# Patient Record
Sex: Female | Born: 1965 | Race: White | Hispanic: No | Marital: Married | Smoking: Former smoker
Health system: Southern US, Community
[De-identification: ages and names within clinical notes are randomized; demographics above are authoritative.]

## PROBLEM LIST (undated history)

## (undated) DIAGNOSIS — T7840XA Allergy, unspecified, initial encounter: Secondary | ICD-10-CM

## (undated) DIAGNOSIS — I10 Essential (primary) hypertension: Secondary | ICD-10-CM

## (undated) DIAGNOSIS — M1990 Unspecified osteoarthritis, unspecified site: Secondary | ICD-10-CM

## (undated) HISTORY — DX: Unspecified osteoarthritis, unspecified site: M19.90

## (undated) HISTORY — PX: OTHER SURGICAL HISTORY: SHX169

## (undated) HISTORY — PX: COLONOSCOPY: SHX174

## (undated) HISTORY — PX: BREAST SURGERY: SHX581

## (undated) HISTORY — PX: TUBAL LIGATION: SHX77

## (undated) HISTORY — DX: Allergy, unspecified, initial encounter: T78.40XA

---

## 2000-12-16 ENCOUNTER — Other Ambulatory Visit: Admission: RE | Admit: 2000-12-16 | Discharge: 2000-12-16 | Payer: Self-pay | Admitting: Obstetrics and Gynecology

## 2001-09-22 ENCOUNTER — Encounter: Payer: Self-pay | Admitting: Family Medicine

## 2001-09-22 ENCOUNTER — Ambulatory Visit (HOSPITAL_COMMUNITY): Admission: RE | Admit: 2001-09-22 | Discharge: 2001-09-22 | Payer: Self-pay | Admitting: Family Medicine

## 2002-12-19 ENCOUNTER — Other Ambulatory Visit: Admission: RE | Admit: 2002-12-19 | Discharge: 2002-12-19 | Payer: Self-pay | Admitting: Obstetrics and Gynecology

## 2003-01-12 ENCOUNTER — Encounter: Payer: Self-pay | Admitting: Obstetrics and Gynecology

## 2003-01-12 ENCOUNTER — Ambulatory Visit (HOSPITAL_COMMUNITY): Admission: RE | Admit: 2003-01-12 | Discharge: 2003-01-12 | Payer: Self-pay | Admitting: Obstetrics and Gynecology

## 2004-01-25 ENCOUNTER — Inpatient Hospital Stay (HOSPITAL_COMMUNITY): Admission: AD | Admit: 2004-01-25 | Discharge: 2004-01-25 | Payer: Self-pay | Admitting: Obstetrics and Gynecology

## 2004-02-04 ENCOUNTER — Inpatient Hospital Stay (HOSPITAL_COMMUNITY): Admission: AD | Admit: 2004-02-04 | Discharge: 2004-02-09 | Payer: Self-pay | Admitting: Obstetrics and Gynecology

## 2004-02-05 IMAGING — US US OB COMP +14 WK
1 series · 13 of 25 positions shown · non-contrast
Comparison: none

CLINICAL DATA: 36 weeks gestation with elevated blood pressure.  Assess fetal weight.

[Series 1: us ob comp +14 wk · 0.37mm/px · 13 of 25 slices shown]
[im 1/25]
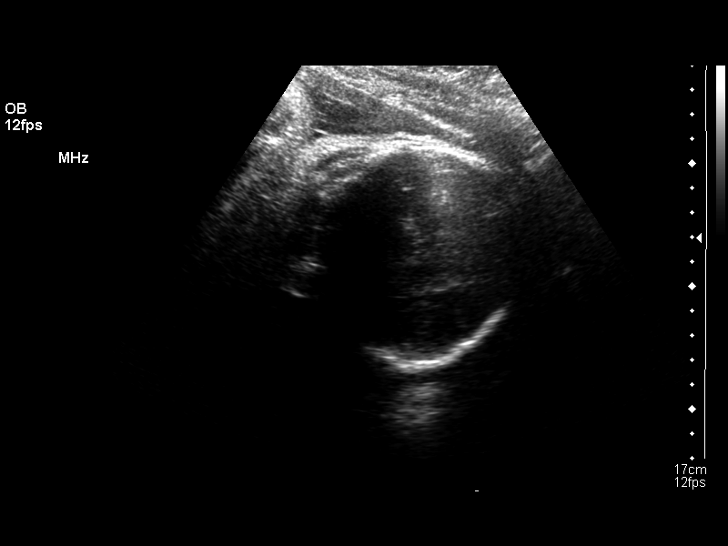
[im 3/25]
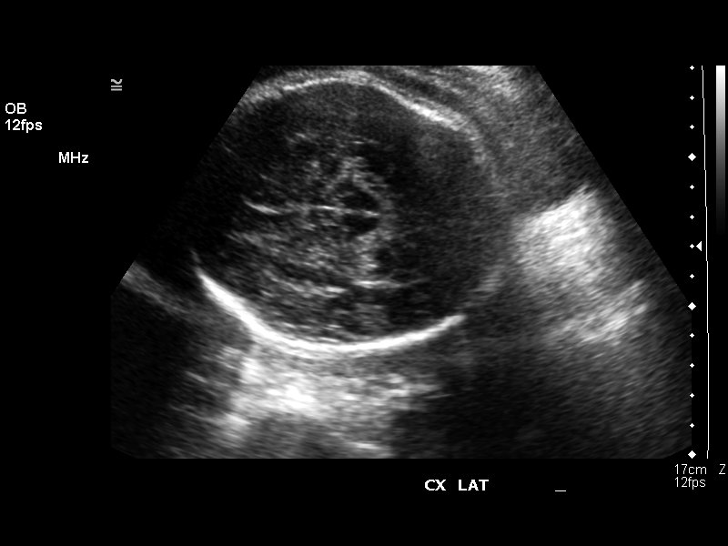
[im 5/25]
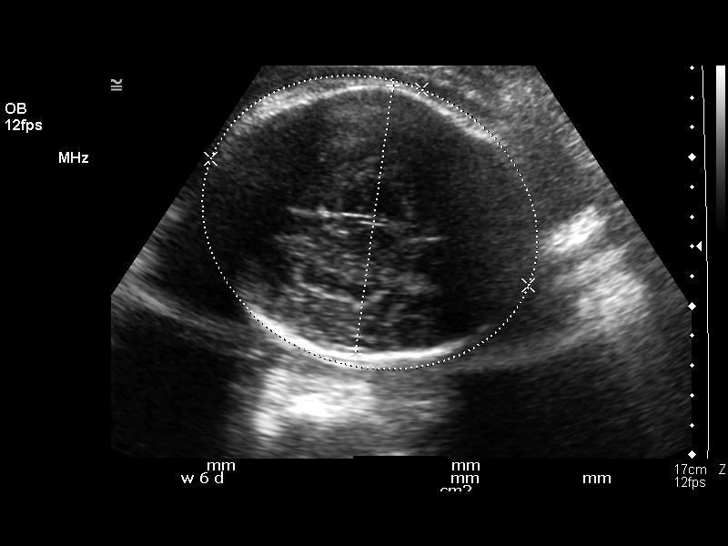
[im 7/25]
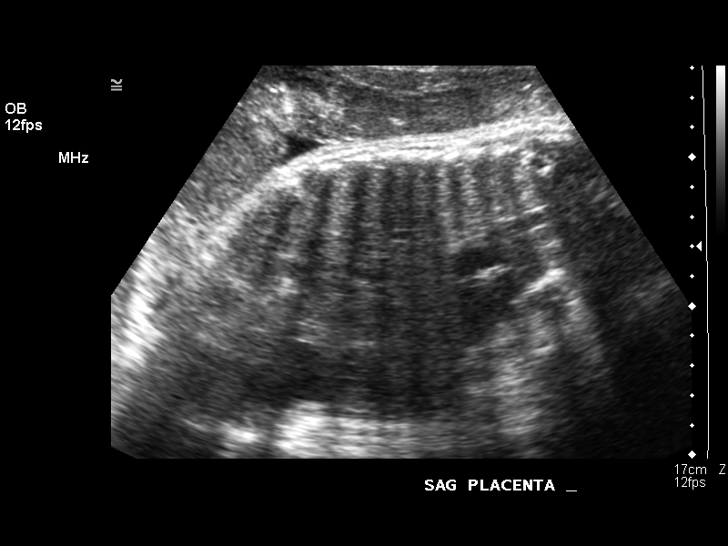
[im 9/25]
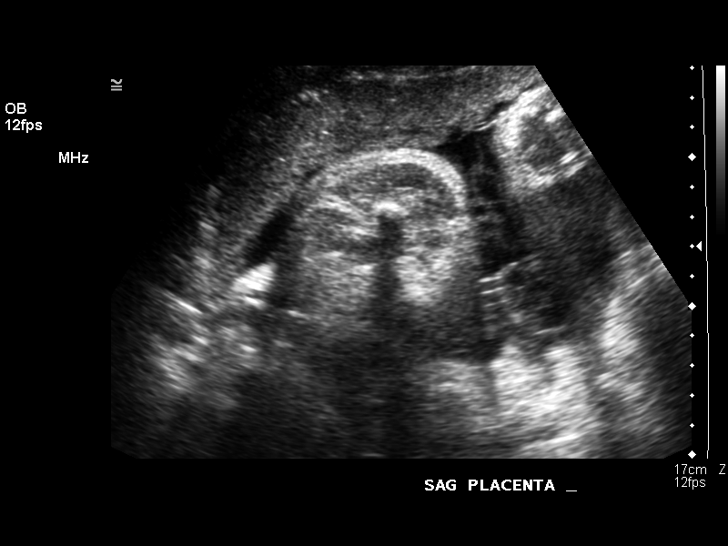
[im 11/25]
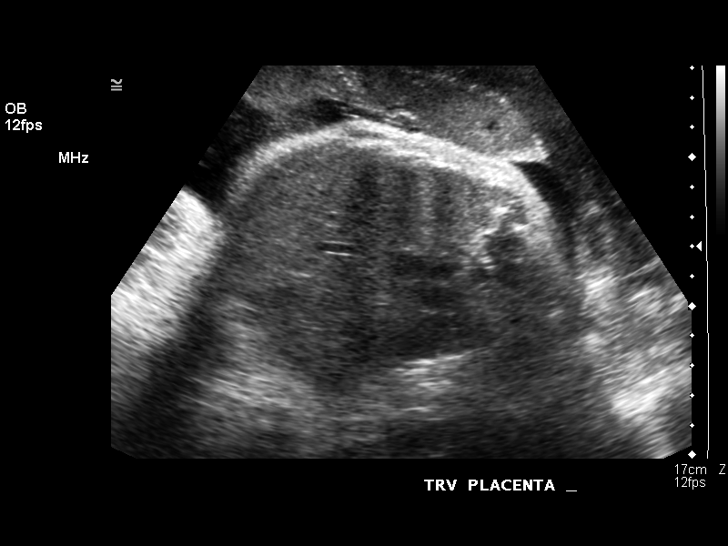
[im 13/25]
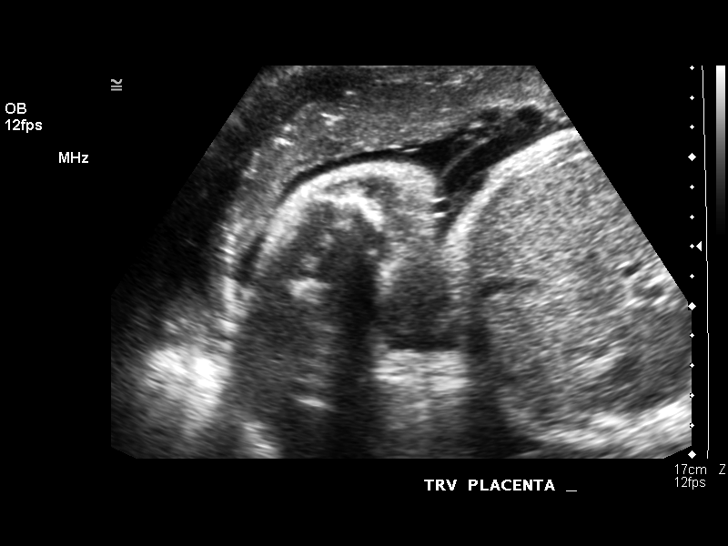
[im 15/25]
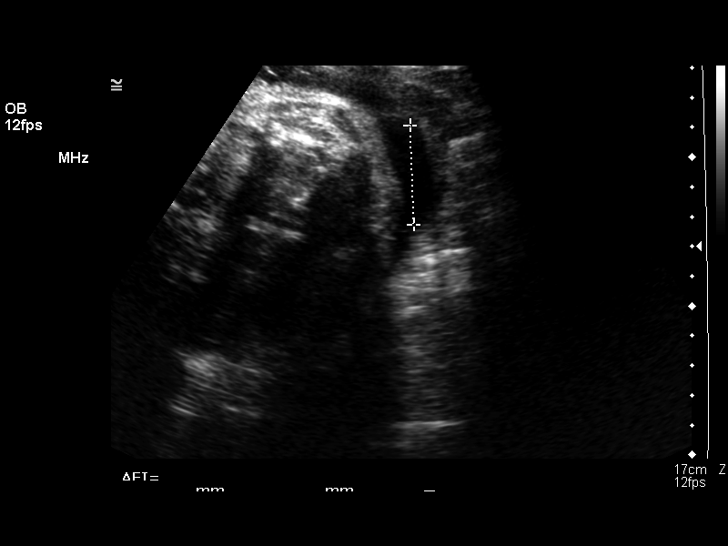
[im 17/25]
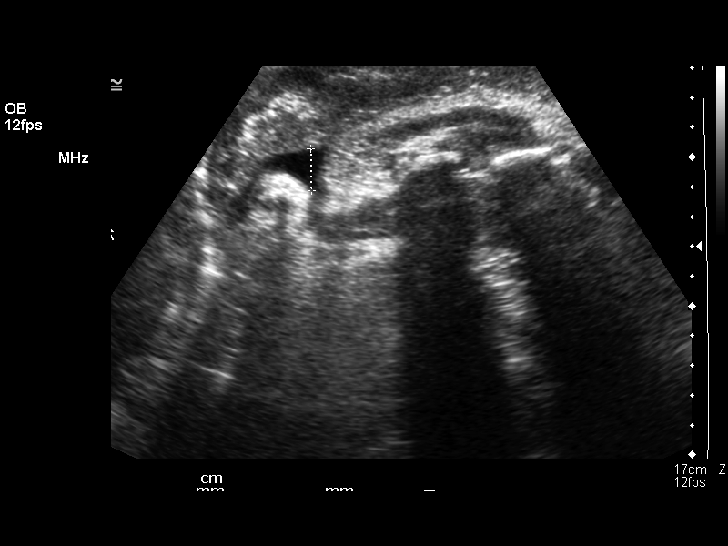
[im 19/25]
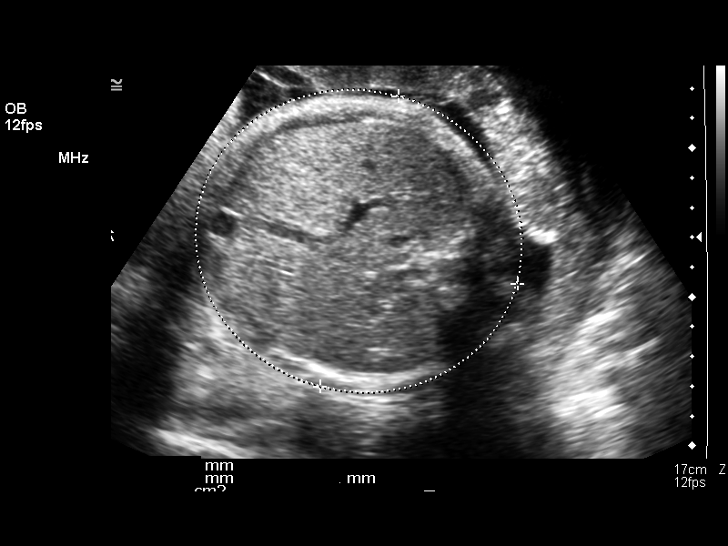
[im 21/25]
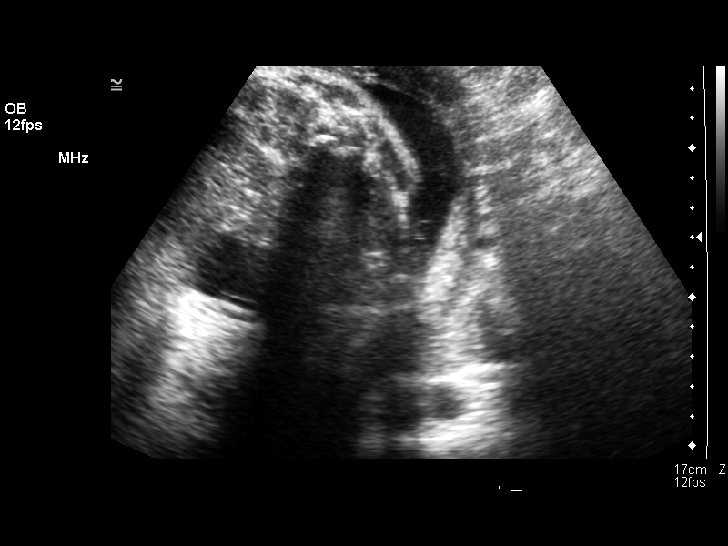
[im 23/25]
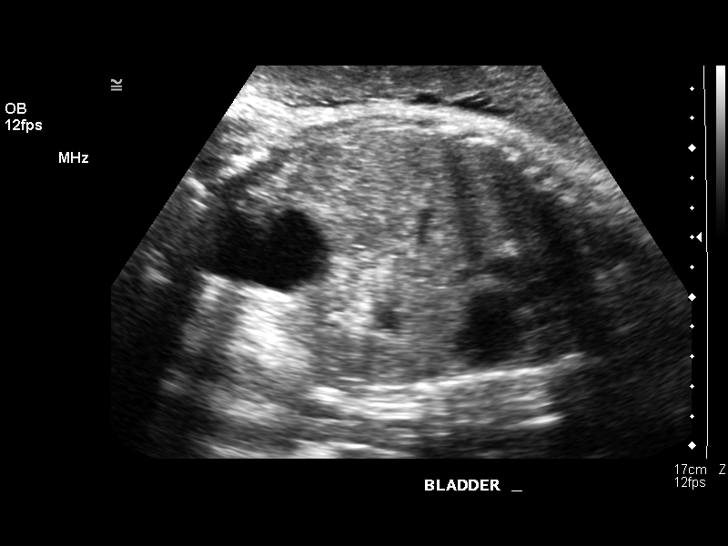
[im 25/25]
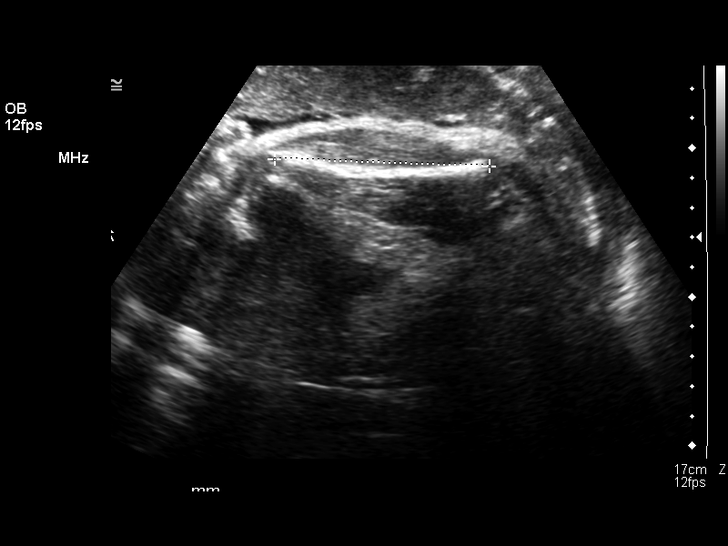

[13 of 25 positions shown; findings below may reference images not displayed]

OBSTETRICAL ULTRASOUND
 Number of Fetuses:  1
 Heart Rate:  131
 Movement:  Yes
 Breathing:  No  
 Presentation:  Cephalic
 Placental Location:  Anterior
 Grade:  II
 Previa:  No
 Amniotic Fluid (Subjective):  Normal
 Amniotic Fluid (Objective):   11.5 cm AFI (5th -95th%ile = 7.7 ? 24.9 cm for 36 wks)

 FETAL BIOMETRY
 BPD:   9.1 cm   36 w 6 d
 HC:   32.8 cm  37 w 1 d
 AC:   33.2 cm   37 w 1 d
 FL:    7.2 cm   37 w 0 d

 MEAN GA:  37 w 0 d

 EFW:  [PU] + / - 464 g (H) 75th ? 90th%ile ([PU] ? [PU] g) for 36 wks

 FETAL ANATOMY  
 Lateral Ventricles:      Visualized     
 Thalami/CSP:      Visualized 
 Posterior Fossa:  Not visualized 
 Nuchal Region:    N/A
 Spine:      Not visualized 
 4 Chamber Heart on Left:      Not visualized 
 Stomach on Left:      Visualized 
 3 Vessel Cord:    Not visualized 
 Cord Insertion site:    Not visualized 
 Kidneys:  Visualized 
 Bladder:  Visualized 
 Extremities:      Not visualized 

 Evaluation limited by:  Advanced gestational age 

 MATERNAL FINDINGS  
 Cervix:   3.2 cm Transabdominally
IMPRESSION: Single living intrauterine fetus in cephalic presentation with subjectively and quantitatively normal amniotic fluid volume.    Best estimated fetal weight is [PU] + / - 464 grams.  
 Assessment of fetal anatomy is limited by the late gestational age.  

 </u12:p>

## 2004-02-07 ENCOUNTER — Encounter (INDEPENDENT_AMBULATORY_CARE_PROVIDER_SITE_OTHER): Payer: Self-pay | Admitting: *Deleted

## 2004-03-20 ENCOUNTER — Other Ambulatory Visit: Admission: RE | Admit: 2004-03-20 | Discharge: 2004-03-20 | Payer: Self-pay | Admitting: Obstetrics and Gynecology

## 2004-11-27 ENCOUNTER — Ambulatory Visit (HOSPITAL_COMMUNITY): Admission: RE | Admit: 2004-11-27 | Discharge: 2004-11-27 | Payer: Self-pay | Admitting: Obstetrics and Gynecology

## 2006-01-14 ENCOUNTER — Encounter: Admission: RE | Admit: 2006-01-14 | Discharge: 2006-01-14 | Payer: Self-pay | Admitting: Obstetrics and Gynecology

## 2009-01-17 ENCOUNTER — Encounter: Admission: RE | Admit: 2009-01-17 | Discharge: 2009-01-17 | Payer: Self-pay | Admitting: Obstetrics and Gynecology
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
## 2010-08-29 NOTE — Op Note (Signed)
NAME:  Nicole [name], ARSENEAU                  ACCOUNT NO.:  [id number]   MEDICAL RECORD NO.:  [id number]          PATIENT TYPE:  AMB   LOCATION:  SDC                           FACILITY:  [facility]   PHYSICIAN:  Zenaida Niece, M.D.DATE OF BIRTH:  1965-09-08   DATE OF PROCEDURE:  11/27/2004  DATE OF DISCHARGE:                                 OPERATIVE REPORT   PREOPERATIVE DIAGNOSIS:  Desires surgical sterility.   POSTOPERATIVE DIAGNOSIS:  Desires surgical sterility.   PROCEDURE:  Laparoscopy with bilateral tubal fulguration.   SURGEON:  Zenaida Niece, M.D.   ANESTHESIA:  General endotracheal tube.   SPECIMENS:  None.   ESTIMATED BLOOD LOSS:  Minimal.   COMPLICATIONS:  None.   FINDINGS:  Normal pelvic and abdominal anatomy.   PROCEDURE IN DETAIL:  The patient was taken to the operating room and placed  in the dorsal supine position.  General anesthesia was induced and she was  placed in mobile stirrups.  The abdomen was prepped and draped in the usual  sterile fashion, bladder drained with a red rubber catheter, Hulka tenaculum  applied to the cervix for uterine manipulation.  Infraumbilical skin was  then infiltrated with 0.25% Marcaine and a 1.5 cm horizontal incision was  made.  The Veress needle was inserted into the peritoneal cavity and  placement confirmed by the water drop test and an opening pressure of 4  mmHg.  CO2 gas was insufflated to a pressure of 40 mmHg and the Veress  needle was removed.  The size 11 disposable trocar was then introduced and  placement confirmed by the laparoscope.  Uterus, tubes and ovaries appeared  normal.  Both fallopian tubes were identified and traced to their fimbriated  ends.  Three segments of the middle of each tube were fulgurated with  bipolar cautery until the amp meter read 0 in all spots.  This achieved good  fulguration and good hemostasis in all spots.  The remainder of her anatomy  appeared normal.  The laparoscope was removed and  all gas allowed to deflate  from the abdomen.  The umbilical trocar was then removed.  The fascia was  reapproximated with a figure-of-eight suture of 0 Vicryl.  Three interrupted  subcuticular  sutures were placed with 4-0 Vicryl Rapide, and the skin was closed with  Dermabond.  The patient tolerated the procedure well, was extubated in the  operating room and taken to the recovery in stable condition.  Counts were  correct.      Zenaida Niece, M.D.  Electronically Signed     TDM/MEDQ  D:  11/27/2004  T:  11/27/2004  Job:  [id number]
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
## 2011-01-20 ENCOUNTER — Other Ambulatory Visit: Payer: Self-pay | Admitting: Obstetrics and Gynecology

## 2011-01-20 DIAGNOSIS — N6321 Unspecified lump in the left breast, upper outer quadrant: Secondary | ICD-10-CM

## 2011-02-02 ENCOUNTER — Ambulatory Visit
Admission: RE | Admit: 2011-02-02 | Discharge: 2011-02-02 | Disposition: A | Discharge status: Home / Self Care | Payer: 59 | Source: Ambulatory Visit | Attending: Obstetrics and Gynecology | Admitting: Obstetrics and Gynecology

## 2011-02-02 ENCOUNTER — Other Ambulatory Visit: Payer: Self-pay | Admitting: Obstetrics and Gynecology

## 2011-02-02 DIAGNOSIS — N6321 Unspecified lump in the left breast, upper outer quadrant: Secondary | ICD-10-CM

## 2011-02-23 ENCOUNTER — Encounter: Payer: Self-pay | Admitting: Internal Medicine

## 2011-07-24 ENCOUNTER — Other Ambulatory Visit: Payer: Self-pay

## 2012-10-06 ENCOUNTER — Ambulatory Visit (INDEPENDENT_AMBULATORY_CARE_PROVIDER_SITE_OTHER): Payer: 59 | Admitting: Nurse Practitioner

## 2012-10-06 ENCOUNTER — Encounter: Payer: Self-pay | Admitting: Nurse Practitioner

## 2012-10-06 VITALS — BP 136/86 | HR 80 | Temp 98.0°F | Ht 63.0 in | Wt 126.0 lb

## 2012-10-06 DIAGNOSIS — L259 Unspecified contact dermatitis, unspecified cause: Secondary | ICD-10-CM

## 2012-10-06 MED ORDER — PREDNISONE 20 MG PO TABS: ORAL_TABLET | ORAL | Status: DC

## 2012-10-06 MED ORDER — METHYLPREDNISOLONE ACETATE 80 MG/ML IJ SUSP
80.0000 mg | Freq: Once | INTRAMUSCULAR | Status: AC
  Administered 2012-10-06: 80 mg via INTRAMUSCULAR

## 2012-10-06 NOTE — Patient Instructions (Addendum)
Poison Oak Poison oak is an inflammation of the skin (contact dermatitis). It is caused by contact with the allergens on the leaves of the oak (toxicodendron) plants. Depending on your sensitivity, the rash may consist simply of redness and itching, or it may also progress to blisters which may break open (rupture). These must be well cared for to prevent secondary germ (bacterial) infection as these infections can lead to scarring. The eyes may also get puffy. The puffiness is worst in the morning and gets better as the day progresses. Healing is best accomplished by keeping any open areas dry, clean, covered with a bandage, and covered with an antibacterial ointment if needed. Without secondary infection, this dermatitis usually heals without scarring within 2 to 3 weeks without treatment. HOME CARE INSTRUCTIONS When you have been exposed to poison oak, it is very important to thoroughly wash with soap and water as soon as the exposure has been discovered. You have about one half hour to remove the plant resin before it will cause the rash. This cleaning will quickly destroy the oil or antigen on the skin (the antigen is what causes the rash). Wash aggressively under the fingernails as any plant resin still there will continue to spread the rash. Do not rub skin vigorously when washing affected area. Poison oak cannot spread if no oil from the plant remains on your body. Rash that has progressed to weeping sores (lesions) will not spread the rash unless you have not washed thoroughly. It is also important to clean any clothes you have been wearing as they may carry active allergens which will spread the rash, even several days later. Avoidance of the plant in the future is the best measure. Poison oak plants can be recognized by the number of leaves. Generally, poison oak has three leaves with flowering branches on a single stem. Diphenhydramine may be purchased over the counter and used as needed for  itching. Do not drive with this medication if it makes you drowsy. Ask your caregiver about medication for children. SEEK IMMEDIATE MEDICAL CARE IF:   Open areas of the rash develop.  You notice redness extending beyond the area of the rash.  There is a pus like discharge.  There is increased pain.  Other signs of infection develop (such as fever). Document Released: 10/04/2002 Document Revised: 06/22/2011 Document Reviewed: 02/13/2009 [employer] Patient Information 2014 [employer].  

## 2012-10-06 NOTE — Progress Notes (Signed)
  Subjective:    Patient ID: Nicole [name], female    DOB: 18-Nov-1965, 47 y.o.   MRN: [id number]  Poison [place] This is a new problem. The current episode started yesterday. The problem has been gradually worsening since onset. The affected locations include the right ear, left arm, right arm, neck and left upper leg. The rash is characterized by itchiness, redness and swelling. She was exposed to nothing. Pertinent negatives include no cough, shortness of breath or vomiting. Past treatments include anti-itch cream. The treatment provided no relief. There is no history of asthma.      Review of Systems  HENT: Positive for ear pain (due to swelling).   Respiratory: Negative for cough and shortness of breath.   Gastrointestinal: Negative for vomiting.       Objective:   Physical Exam  Constitutional: She is oriented to person, place, and time. She appears well-developed and well-nourished.  HENT:  Head: Normocephalic.  Neck: Normal range of motion. Neck supple. No thyromegaly present.  Cardiovascular: Normal rate, regular rhythm, normal heart sounds and intact distal pulses.   Pulmonary/Chest: Effort normal and breath sounds normal.  Musculoskeletal: Normal range of motion. She exhibits no edema.  Neurological: She is alert and oriented to person, place, and time.  Skin: Skin is warm and dry. Rash noted. There is erythema.  Scattered rash on arms, legs, and present right ear  Psychiatric: She has a normal mood and affect. Her behavior is normal. Judgment and thought content normal.      BP 136/86  Pulse 80  Temp(Src) 98 F (36.7 C) (Oral)  Ht 5\' 3"  (1.6 m)  Wt 126 lb (57.153 kg)  BMI 22.33 kg/m2     Assessment & Plan:  1. Contact dermatitis Avoid scratching Cool compresses if helps Benadryl OTC RTO prn - methylPREDNISolone acetate (DEPO-MEDROL) injection 80 mg; Inject 1 mL (80 mg total) into the muscle once. - predniSONE (DELTASONE) 20 MG tablet; 2 PO qd X5 days  Dispense: 10  tablet; Refill: 0  Mary-Margaret Daphine Deutscher, FNP

## 2013-03-30 ENCOUNTER — Encounter: Payer: Self-pay | Admitting: Nurse Practitioner

## 2013-03-30 ENCOUNTER — Ambulatory Visit (INDEPENDENT_AMBULATORY_CARE_PROVIDER_SITE_OTHER): Payer: 59 | Admitting: Nurse Practitioner

## 2013-03-30 ENCOUNTER — Telehealth: Payer: Self-pay | Admitting: Nurse Practitioner

## 2013-03-30 VITALS — BP 144/89 | HR 86 | Temp 98.9°F | Ht 63.0 in | Wt 128.0 lb

## 2013-03-30 DIAGNOSIS — J019 Acute sinusitis, unspecified: Secondary | ICD-10-CM

## 2013-03-30 DIAGNOSIS — R059 Cough, unspecified: Secondary | ICD-10-CM

## 2013-03-30 DIAGNOSIS — R05 Cough: Secondary | ICD-10-CM

## 2013-03-30 MED ORDER — HYDROCODONE-HOMATROPINE 5-1.5 MG/5ML PO SYRP: 5.0000 mL | ORAL_SOLUTION | Freq: Three times a day (TID) | ORAL | Status: DC | PRN

## 2013-03-30 MED ORDER — AZITHROMYCIN 250 MG PO TABS: ORAL_TABLET | ORAL | Status: DC

## 2013-03-30 NOTE — Patient Instructions (Signed)

## 2013-03-30 NOTE — Telephone Encounter (Signed)
Cough and congestion. Concerned that it may progress into bronchitis.  Appt scheduled for this afternoon. Patient aware.

## 2013-03-30 NOTE — Progress Notes (Signed)
   Subjective:    Patient ID: Nicole [name], female    DOB: [birth year], 47 y.o.   MRN: [id number]  HPI Patient in c/o chest congestion and cough with lots of facial pressure. Started over a week ago an dit is keeping her up at night.    Review of Systems  Constitutional: Positive for fever (low grade at night). Negative for chills, diaphoresis and appetite change.  HENT: Positive for congestion, ear pain, rhinorrhea and sinus pressure. Negative for sore throat and trouble swallowing.   Respiratory: Positive for cough (nonproductive).        Objective:   Physical Exam  Constitutional: She is oriented to person, place, and time. She appears well-developed and well-nourished.  HENT:  Right Ear: Hearing, tympanic membrane, external ear and ear canal normal.  Left Ear: Hearing, tympanic membrane, external ear and ear canal normal.  Nose: Mucosal edema and rhinorrhea present. Right sinus exhibits maxillary sinus tenderness. Right sinus exhibits no frontal sinus tenderness. Left sinus exhibits maxillary sinus tenderness. Left sinus exhibits no frontal sinus tenderness.  Mouth/Throat: Uvula is midline, oropharynx is clear and moist and mucous membranes are normal.  Cardiovascular: Normal rate, regular rhythm and normal heart sounds.   Pulmonary/Chest: Effort normal and breath sounds normal.  Dry cough with scattered rhonchi  Neurological: She is alert and oriented to person, place, and time.  Skin: Skin is warm and dry.    BP 144/89  Pulse 86  Temp(Src) 98.9 F (37.2 C) (Oral)  Ht 5\' 3"  (1.6 m)  Wt 128 lb (58.06 kg)  BMI 22.68 kg/m2       Assessment & Plan:   1. Acute sinusitis   2. Cough    Meds ordered this encounter  Medications  . azithromycin (ZITHROMAX Z-PAK) 250 MG tablet    Sig: As directed    Dispense:  6 each    Refill:  0    Order Specific Question:  Supervising Provider    Answer:  Ernestina Penna [[id number]]  . HYDROcodone-homatropine (HYCODAN) 5-1.5 MG/5ML syrup   Sig: Take 5 mLs by mouth every 8 (eight) hours as needed for cough.    Dispense:  120 mL    Refill:  0    Order Specific Question:  Supervising Provider    Answer:  Ernestina Penna [[id number]]   1. Take meds as prescribed 2. Use a cool mist humidifier especially during the winter months and when heat has  been humid. 3. Use saline nose sprays frequently 4. Saline irrigations of the nose can be very helpful if done frequently.  * 4X daily for 1 week*  * Use of a nettie pot can be helpful with this. Follow directions with this* 5. Drink plenty of fluids 6. Keep thermostat turn down low 7.For any cough or congestion  Use plain Mucinex- regular strength or max strength is fine   * Children- consult with Pharmacist for dosing 8. For fever or aces or pains- take tylenol or ibuprofen appropriate for age and weight.  * for fevers greater than 101 orally you may alternate ibuprofen and tylenol every  3 hours.   Mary-Margaret Daphine Deutscher, FNP

## 2014-04-19 ENCOUNTER — Ambulatory Visit
Admission: RE | Admit: 2014-04-19 | Discharge: 2014-04-19 | Disposition: A | Discharge status: Home / Self Care | Payer: 59 | Source: Ambulatory Visit | Attending: Family Medicine | Admitting: Family Medicine

## 2014-04-19 ENCOUNTER — Other Ambulatory Visit: Payer: Self-pay | Admitting: Family Medicine

## 2014-04-19 DIAGNOSIS — M79602 Pain in left arm: Secondary | ICD-10-CM

## 2015-04-11 ENCOUNTER — Ambulatory Visit (INDEPENDENT_AMBULATORY_CARE_PROVIDER_SITE_OTHER): Payer: 59 | Admitting: Pediatrics

## 2015-04-11 ENCOUNTER — Encounter: Payer: Self-pay | Admitting: Pediatrics

## 2015-04-11 VITALS — BP 126/88 | HR 88 | Temp 98.9°F | Ht 63.0 in | Wt 125.2 lb

## 2015-04-11 DIAGNOSIS — J019 Acute sinusitis, unspecified: Secondary | ICD-10-CM

## 2015-04-11 MED ORDER — AZITHROMYCIN 250 MG PO TABS: ORAL_TABLET | ORAL | Status: DC

## 2015-04-11 NOTE — Progress Notes (Signed)
    Subjective:    Patient ID: Nicole [name], female    DOB: Oct 25, 1965, 49 y.o.   MRN: [id number]  CC: Cough; Fever; Nasal Congestion; and Headache   HPI: Nicole [name] is a 49 y.o. female presenting for Cough; Fever; Nasal Congestion; and Headache   Sick for past 8 days, cough, runny nose, congestion, feels like getting worse the last few days Appetite has been fine Coughing more last couple of days Former smoker for 25 yrs   Depression screen PHQ 2/9 04/11/2015  Decreased Interest 0  Down, Depressed, Hopeless 0  PHQ - 2 Score 0     Relevant past medical, surgical, family and social history reviewed and updated as indicated. Interim medical history since our last visit reviewed. Allergies and medications reviewed and updated.    ROS: Per HPI unless specifically indicated above  History  Smoking status  . Former Smoker  Smokeless tobacco  . Never Used    Past Medical History There are no active problems to display for this patient.   Current Outpatient Prescriptions  Medication Sig Dispense Refill  . azithromycin (ZITHROMAX) 250 MG tablet Take 2 the first day and then one each day after. 6 tablet 0   No current facility-administered medications for this visit.       Objective:    BP 126/88 mmHg  Pulse 88  Temp(Src) 98.9 F (37.2 C) (Oral)  Ht 5\' 3"  (1.6 m)  Wt 125 lb 3.2 oz (56.79 kg)  BMI 22.18 kg/m2  Wt Readings from Last 3 Encounters:  04/11/15 125 lb 3.2 oz (56.79 kg)  03/30/13 128 lb (58.06 kg)  10/06/12 126 lb (57.153 kg)     Gen: NAD, alert, cooperative with exam, NCAT, congested EYES: EOMI, no scleral injection or icterus ENT:  TMs dull gray b/l, OP with erythema LYMPH: no cervical LAD CV: NRRR, normal S1/S2, no murmur, distal pulses 2+ b/l Resp: CTABL, no wheezes, normal WOB Abd: +BS, soft, NTND. no guarding or organomegaly Ext: No edema, warm Neuro: Alert and oriented, strength equal b/l UE and LE, coordination grossly normal MSK: normal  muscle bulk     Assessment & Plan:    Nicole [name] was seen today for cough, fever, nasal congestion and headache found to have acute sinusitis.  Diagnoses and all orders for this visit:  Acute sinusitis, recurrence not specified, unspecified location -     azithromycin (ZITHROMAX) 250 MG tablet; Take 2 the first day and then one each day after.   Follow up plan: Return if symptoms worsen or fail to improve.  Assunta Found, MD [facility] 04/11/2015, 9:04 AM

## 2015-04-11 NOTE — Patient Instructions (Signed)
Ibuprofen 600mg  three times a day Sinus rinses three times a day

## 2015-11-11 ENCOUNTER — Encounter: Payer: Self-pay | Admitting: Nurse Practitioner

## 2016-01-08 ENCOUNTER — Ambulatory Visit (INDEPENDENT_AMBULATORY_CARE_PROVIDER_SITE_OTHER): Payer: 59 | Admitting: Family Medicine

## 2016-01-08 ENCOUNTER — Encounter: Payer: Self-pay | Admitting: Family Medicine

## 2016-01-08 ENCOUNTER — Ambulatory Visit: Payer: 59 | Admitting: Family Medicine

## 2016-01-08 VITALS — BP 117/81 | HR 82 | Temp 97.4°F | Ht 63.0 in | Wt 132.2 lb

## 2016-01-08 DIAGNOSIS — J029 Acute pharyngitis, unspecified: Secondary | ICD-10-CM

## 2016-01-08 LAB — RAPID STREP SCREEN (MED CTR MEBANE ONLY): Strep Gp A Ag, IA W/Reflex: NEGATIVE

## 2016-01-08 LAB — CULTURE, GROUP A STREP

## 2016-01-08 NOTE — Progress Notes (Signed)
Subjective:  Patient ID: Nicole [name], female    DOB: 10-03-1965  Age: 50 y.o. MRN: [id number]  CC: Sore Throat   HPI Casha Fischl presents for Patient presents with upper respiratory congestion. There is moderate sore throat. Patient reports coughing frequently as well No sputum noted. There is no fever no chills no sweats. The patient denies being short of breath. Onset was 3days ago. She looked in her own throat and saw to tonsilloliths on the left tonsil and thought she had exudates and strep.  History Tashunda has no past medical history on file.   She has a past surgical history that includes Breast surgery (25years ago); Tubal ligation; and ablasion.   Her family history includes Alzheimer's disease in her father; Congestive Heart Failure in her father; Heart disease in her father; Hypertension in her mother; Hypothyroidism in her mother.She reports that she has quit smoking. She has never used smokeless tobacco. She reports that she does not drink alcohol or use drugs.    ROS Review of Systems  Constitutional: Negative for appetite change, chills, diaphoresis, fatigue and fever.  HENT: Positive for postnasal drip, sinus pressure, sneezing and sore throat. Negative for congestion, ear pain, hearing loss, rhinorrhea and trouble swallowing.   Respiratory: Negative for cough, chest tightness and shortness of breath.   Cardiovascular: Negative for chest pain and palpitations.  Gastrointestinal: Negative for abdominal pain.  Musculoskeletal: Negative for arthralgias.  Skin: Negative for rash.    Objective:  BP 117/81   Pulse 82   Temp 97.4 F (36.3 C) (Oral)   Ht 5\' 3"  (1.6 m)   Wt 132 lb 4 oz (60 kg)   BMI 23.43 kg/m   BP Readings from Last 3 Encounters:  01/08/16 117/81  04/11/15 126/88  03/30/13 (!) 144/89    Wt Readings from Last 3 Encounters:  01/08/16 132 lb 4 oz (60 kg)  04/11/15 125 lb 3.2 oz (56.8 kg)  03/30/13 128 lb (58.1 kg)     Physical Exam    Constitutional: She appears well-developed and well-nourished.  HENT:  Head: Normocephalic and atraumatic.  Right Ear: Tympanic membrane and external ear normal. No decreased hearing is noted.  Left Ear: Tympanic membrane and external ear normal. No decreased hearing is noted.  Nose: Mucosal edema present. Right sinus exhibits no frontal sinus tenderness. Left sinus exhibits no frontal sinus tenderness.  Mouth/Throat: No oropharyngeal exudate (two liths noted, left tonsil. 1 mm each) or posterior oropharyngeal erythema.  Neck: No Brudzinski's sign noted.  Pulmonary/Chest: Breath sounds normal. No respiratory distress.  Lymphadenopathy:       Head (right side): No preauricular adenopathy present.       Head (left side): No preauricular adenopathy present.       Right cervical: No superficial cervical adenopathy present.      Left cervical: No superficial cervical adenopathy present.     No results found for: WBC, HGB, HCT, PLT, GLUCOSE, CHOL, TRIG, HDL, LDLDIRECT, LDLCALC, ALT, AST, NA, K, CL, CREATININE, BUN, CO2, TSH, PSA, INR, GLUF, HGBA1C, MICROALBUR  Dg Cervical Spine Complete  Result Date: 04/19/2014 CLINICAL DATA:  Pain at the base of the LEFT neck for 1 year. Worsening symptoms in the past few months. No trauma. EXAM: CERVICAL SPINE  4+ VIEWS COMPARISON:  None. FINDINGS: Straightening of the normal cervical lordosis. Prevertebral soft tissues appear normal. C5-C6 and C6-C7 moderate to severe disc degeneration is present with disc space loss and endplate osteophytes. Both of these levels demonstrate bilateral uncovertebral  spurring potentially affecting the C6 and C7 nerves bilaterally. The other levels of cervical spine appear within normal limits. Odontoid intact. Cervicothoracic junction normal. IMPRESSION: Moderate C5-C6 and C6-C7 cervical spondylosis. Uncovertebral spurring produces mild bony foraminal stenosis at both levels potentially affecting the C6 and C7 nerves. Electronically  Signed   By: Dereck Ligas M.D.   On: 04/19/2014 14:14    Assessment & Plan:   Shakeshia was seen today for sore throat.  Diagnoses and all orders for this visit:  Sore throat -     Rapid strep screen (not at [facility])    Strep negative. Use   I have discontinued Ms. Alpert's azithromycin.  No orders of the defined types were placed in this encounter.    Follow-up: No Follow-up on file.  Claretta Fraise, M.D.

## 2016-03-30 ENCOUNTER — Encounter: Payer: Self-pay | Admitting: Family Medicine

## 2016-03-30 ENCOUNTER — Ambulatory Visit (INDEPENDENT_AMBULATORY_CARE_PROVIDER_SITE_OTHER): Payer: 59 | Admitting: Family Medicine

## 2016-03-30 VITALS — BP 130/88 | HR 81 | Temp 97.9°F | Ht 63.0 in | Wt 134.2 lb

## 2016-03-30 DIAGNOSIS — J209 Acute bronchitis, unspecified: Secondary | ICD-10-CM

## 2016-03-30 MED ORDER — AZITHROMYCIN 250 MG PO TABS: ORAL_TABLET | ORAL | 0 refills | Status: DC

## 2016-03-30 NOTE — Progress Notes (Signed)
BP 130/88   Pulse 81   Temp 97.9 F (36.6 C) (Oral)   Ht 5\' 3"  (1.6 m)   Wt 134 lb 4 oz (60.9 kg)   BMI 23.78 kg/m    Subjective:    Patient ID: Nicole [name], female    DOB: 06/17/1965, 50 y.o.   MRN: [id number]  HPI: Nicole [name] is a 50 y.o. female presenting on 03/30/2016 for Sinusitis (sinus congestion and drainage, ears feel full and are popping); Fever; and Cough   HPI Cough and fever and congestion Patient has seen been having sinus congestion and low grade temperature in the 99 to low 100 range. She has been having a cough that is productive of yellow-green sputum. She's been having some sinus congestion and postnasal drainage. She also has some pressure and fullness in her ears and they have been popping on and off when she is coughing. She denies any sick contacts that she knows of. She has used some DayQuil and NyQuil which helped some but not significantly.  Relevant past medical, surgical, family and social history reviewed and updated as indicated. Interim medical history since our last visit reviewed. Allergies and medications reviewed and updated.  Review of Systems  Constitutional: Negative for chills and fever.  HENT: Positive for congestion, postnasal drip, rhinorrhea, sinus pressure and sore throat. Negative for ear discharge, ear pain and sneezing.   Eyes: Negative for pain, redness and visual disturbance.  Respiratory: Positive for cough. Negative for chest tightness, shortness of breath and wheezing.   Cardiovascular: Negative for chest pain and leg swelling.  Genitourinary: Negative for difficulty urinating and dysuria.  Musculoskeletal: Negative for back pain and gait problem.  Skin: Negative for rash.  Neurological: Negative for light-headedness and headaches.  Psychiatric/Behavioral: Negative for agitation and behavioral problems.  All other systems reviewed and are negative.   Per HPI unless specifically indicated above   Allergies as of 03/30/2016        Reactions   Penicillins       Medication List       Accurate as of 03/30/16 10:46 AM. Always use your most recent med list.          azithromycin 250 MG tablet Commonly known as:  ZITHROMAX Take 2 the first day and then one each day after.   DAYQUIL MULTI-SYMPTOM COLD/FLU PO Take by mouth.          Objective:    BP 130/88   Pulse 81   Temp 97.9 F (36.6 C) (Oral)   Ht 5\' 3"  (1.6 m)   Wt 134 lb 4 oz (60.9 kg)   BMI 23.78 kg/m   Wt Readings from Last 3 Encounters:  03/30/16 134 lb 4 oz (60.9 kg)  01/08/16 132 lb 4 oz (60 kg)  04/11/15 125 lb 3.2 oz (56.8 kg)    Physical Exam  Constitutional: She is oriented to person, place, and time. She appears well-developed and well-nourished. No distress.  HENT:  Right Ear: Tympanic membrane, external ear and ear canal normal.  Left Ear: Tympanic membrane, external ear and ear canal normal.  Nose: Mucosal edema and rhinorrhea present. No epistaxis. Right sinus exhibits no maxillary sinus tenderness and no frontal sinus tenderness. Left sinus exhibits no maxillary sinus tenderness and no frontal sinus tenderness.  Mouth/Throat: Uvula is midline and mucous membranes are normal. Posterior oropharyngeal edema and posterior oropharyngeal erythema present. No oropharyngeal exudate or tonsillar abscesses.  Eyes: Conjunctivae are normal.  Cardiovascular: Normal rate, regular rhythm,  normal heart sounds and intact distal pulses.   No murmur heard. Pulmonary/Chest: Effort normal and breath sounds normal. No respiratory distress. She has no wheezes. She has no rales.  Musculoskeletal: Normal range of motion. She exhibits no edema or tenderness.  Neurological: She is alert and oriented to person, place, and time. Coordination normal.  Skin: Skin is warm and dry. No rash noted. She is not diaphoretic.  Psychiatric: She has a normal mood and affect. Her behavior is normal.  Vitals reviewed.     Assessment & Plan:   Problem List  Items Addressed This Visit    None    Visit Diagnoses    Acute bronchitis, unspecified organism    -  Primary   Relevant Medications   azithromycin (ZITHROMAX) 250 MG tablet       Follow up plan: Return if symptoms worsen or fail to improve.  Counseling provided for all of the vaccine components No orders of the defined types were placed in this encounter.   Caryl Pina, MD [facility] 03/30/2016, 10:46 AM

## 2017-02-02 DIAGNOSIS — Z1231 Encounter for screening mammogram for malignant neoplasm of breast: Secondary | ICD-10-CM | POA: Diagnosis not present

## 2017-02-02 DIAGNOSIS — Z6823 Body mass index (BMI) 23.0-23.9, adult: Secondary | ICD-10-CM | POA: Diagnosis not present

## 2017-02-02 DIAGNOSIS — Z01419 Encounter for gynecological examination (general) (routine) without abnormal findings: Secondary | ICD-10-CM | POA: Diagnosis not present

## 2017-12-10 DIAGNOSIS — Z0000 Encounter for general adult medical examination without abnormal findings: Secondary | ICD-10-CM | POA: Diagnosis not present

## 2017-12-15 DIAGNOSIS — R0602 Shortness of breath: Secondary | ICD-10-CM | POA: Diagnosis not present

## 2017-12-15 DIAGNOSIS — M542 Cervicalgia: Secondary | ICD-10-CM | POA: Diagnosis not present

## 2017-12-15 DIAGNOSIS — M545 Low back pain: Secondary | ICD-10-CM | POA: Diagnosis not present

## 2017-12-22 DIAGNOSIS — R0602 Shortness of breath: Secondary | ICD-10-CM | POA: Diagnosis not present

## 2017-12-22 DIAGNOSIS — M545 Low back pain: Secondary | ICD-10-CM | POA: Diagnosis not present

## 2017-12-22 DIAGNOSIS — M542 Cervicalgia: Secondary | ICD-10-CM | POA: Diagnosis not present

## 2018-01-12 DIAGNOSIS — R0602 Shortness of breath: Secondary | ICD-10-CM | POA: Diagnosis not present

## 2018-01-12 DIAGNOSIS — R942 Abnormal results of pulmonary function studies: Secondary | ICD-10-CM | POA: Diagnosis not present

## 2018-01-12 DIAGNOSIS — Z6821 Body mass index (BMI) 21.0-21.9, adult: Secondary | ICD-10-CM | POA: Diagnosis not present

## 2018-02-16 DIAGNOSIS — Z1231 Encounter for screening mammogram for malignant neoplasm of breast: Secondary | ICD-10-CM | POA: Diagnosis not present

## 2018-02-16 DIAGNOSIS — Z6822 Body mass index (BMI) 22.0-22.9, adult: Secondary | ICD-10-CM | POA: Diagnosis not present

## 2018-02-16 DIAGNOSIS — Z01419 Encounter for gynecological examination (general) (routine) without abnormal findings: Secondary | ICD-10-CM | POA: Diagnosis not present

## 2019-06-12 ENCOUNTER — Telehealth: Payer: Self-pay | Admitting: *Deleted

## 2019-06-12 NOTE — Telephone Encounter (Signed)
-----   Message from Jerene Bears, MD sent at 06/12/2019  3:59 PM EST ----- 2 day prep MiraLax + SuTab Pt request She is already scheduled for visit in April. Thanks [employer]

## 2019-06-15 ENCOUNTER — Other Ambulatory Visit: Payer: Self-pay

## 2019-06-15 ENCOUNTER — Ambulatory Visit: Payer: 59 | Admitting: Family Medicine

## 2019-06-15 ENCOUNTER — Encounter: Payer: Self-pay | Admitting: Family Medicine

## 2019-06-15 DIAGNOSIS — M999 Biomechanical lesion, unspecified: Secondary | ICD-10-CM | POA: Diagnosis not present

## 2019-06-15 DIAGNOSIS — M5030 Other cervical disc degeneration, unspecified cervical region: Secondary | ICD-10-CM | POA: Insufficient documentation

## 2019-06-15 HISTORY — DX: Biomechanical lesion, unspecified: M99.9

## 2019-06-15 MED ORDER — GABAPENTIN 100 MG PO CAPS: 200.0000 mg | ORAL_CAPSULE | Freq: Every day | ORAL | 0 refills | Status: DC

## 2019-06-15 MED ORDER — VITAMIN D (ERGOCALCIFEROL) 1.25 MG (50000 UNIT) PO CAPS: 50000.0000 [IU] | ORAL_CAPSULE | ORAL | 0 refills | Status: DC

## 2019-06-15 NOTE — Assessment & Plan Note (Signed)
Decision today to treat with OMT was based on Physical Exam  After verbal consent patient was treated with HVLA, ME, FPR techniques in cervical, thoracic, rib   Patient tolerated the procedure well with improvement in symptoms  Patient given exercises, stretches and lifestyle modifications  See medications in patient instructions if given  Patient will follow up in 4-8 weeks

## 2019-06-15 NOTE — Progress Notes (Signed)
[place] Phone: [phone] Subjective:   Nicole [name], am serving as a scribe for Dr. Hulan Saas. This visit occurred during the SARS-CoV-2 public health emergency.  Safety protocols were in place, including screening questions prior to the visit, additional usage of staff PPE, and extensive cleaning of exam room while observing appropriate contact time as indicated for disinfecting solutions.   I'm seeing this patient by the request  of:  [name] primary care provider on file.  CC: Neck pain  [id number]  Nicole [name] is a 54 y.o. female coming in with complaint of neck pain. Pain at base of neck as well as numbness. Worse with strengthening and using her arms. Patient has been working from home for one year. Denies any radiating symptoms. Uses icy hot and heat.  Patient has noticed that she has been dropping things from time to time.    [facility] xray 2016 IMPRESSION: Moderate C5-C6 and C6-C7 cervical spondylosis. Uncovertebral spurring produces mild bony foraminal stenosis at both levels potentially affecting the C6 and C7 nerves.  Patient also brings in a report from 2019.  This was independently visualized by me showing that there has been [name] significant progression from her 2016 x-rays.     [name] past medical history on file. Past Surgical History:  Procedure Laterality Date  . ablasion    . BREAST SURGERY  25years ago  . TUBAL LIGATION     Social History   Socioeconomic History  . Marital status: Married    Spouse name: Not on file  . Number of children: Not on file  . Years of education: Not on file  . Highest education level: Not on file  Occupational History  . Not on file  Tobacco Use  . Smoking status: Former Research scientist (life sciences)  . Smokeless tobacco: Never Used  Substance and Sexual Activity  . Alcohol use: [name]  . Drug use: [name]  . Sexual activity: Not on file  Other Topics Concern  . Not on file    Social History Narrative  . Not on file   Social Determinants of Health   Financial Resource Strain:   . Difficulty of Paying Living Expenses: Not on file  Food Insecurity:   . Worried About Charity fundraiser in the Last Year: Not on file  . Ran Out of Food in the Last Year: Not on file  Transportation Needs:   . Lack of Transportation (Medical): Not on file  . Lack of Transportation (Non-Medical): Not on file  Physical Activity:   . Days of Exercise per Week: Not on file  . Minutes of Exercise per Session: Not on file  Stress:   . Feeling of Stress : Not on file  Social Connections:   . Frequency of Communication with Friends and Family: Not on file  . Frequency of Social Gatherings with Friends and Family: Not on file  . Attends Religious Services: Not on file  . Active Member of Clubs or Organizations: Not on file  . Attends Archivist Meetings: Not on file  . Marital Status: Not on file   Allergies  Allergen Reactions  . Penicillins    Family History  Problem Relation Age of Onset  . Hypertension Mother   . Hypothyroidism Mother   . Heart disease Father   . Alzheimer's disease Father   . Congestive Heart Failure Father       Current Outpatient Medications (Respiratory):  .  Pseudoephedrine-APAP-DM (DAYQUIL MULTI-SYMPTOM COLD/FLU PO), Take by mouth.    Current Outpatient Medications (Other):  .  azithromycin (ZITHROMAX) 250 MG tablet, Take 2 the first day and then one each day after. .  gabapentin (NEURONTIN) 100 MG capsule, Take 2 capsules (200 mg total) by mouth at bedtime. .  Vitamin D, Ergocalciferol, (DRISDOL) 1.25 MG (50000 UNIT) CAPS capsule, Take 1 capsule (50,000 Units total) by mouth every 7 (seven) days.   Reviewed prior external information including notes and imaging from  primary care provider As well as notes that were available from care everywhere and other healthcare systems.  Past medical history, social, surgical and family  history all reviewed in electronic medical record.  [name] pertanent information unless stated regarding to the chief complaint.   Review of Systems:  [name] headache, visual changes, nausea, vomiting, diarrhea, constipation, dizziness, abdominal pain, skin rash, fevers, chills, night sweats, weight loss, swollen lymph nodes, body aches, joint swelling, chest pain, shortness of breath, mood changes. POSITIVE muscle aches  Objective  Blood pressure (!) 122/92, pulse 82, height 5\' 3"  (1.6 m), weight 134 lb (60.8 kg), SpO2 99 %.   General: [name] apparent distress alert and oriented x3 mood and affect normal, dressed appropriately.  HEENT: Pupils equal, extraocular movements intact  Respiratory: Patient's speak in full sentences and does not appear short of breath  Cardiovascular: [name] lower extremity edema, non tender, [name] erythema  Skin: Warm dry intact with [name] signs of infection or rash on extremities or on axial skeleton.  Abdomen: Soft nontender  Neuro: Cranial nerves II through XII are intact, neurovascularly intact in all extremities with 2+ DTRs and 2+ pulses.  Lymph: [name] lymphadenopathy of posterior or anterior cervical chain or axillae bilaterally.  Gait normal with good balance and coordination.  MSK:  Non tender with full range of motion and good stability and symmetric strength and tone of shoulders, elbows, wrist, hip, knee and ankles bilaterally.  Neck exam does have some mild loss of lordosis.  Negative Spurling's but patient does have significant weakness noted in the C8 distribution.  Deep tendon reflexes though are symmetric.  Mild crepitus with range of motion of the neck.  Osteopathic findings C2 flexed rotated and side bent right C6 flexed rotated and side bent left T9 extended rotated and side bent left  [id number]; 15 additional minutes spent for Therapeutic exercises as stated in above notes.  This included exercises focusing on stretching, strengthening, with significant focus on eccentric  aspects.   Long term goals include an improvement in range of motion, strength, endurance as well as avoiding reinjury. Patient's frequency would include in 1-2 times a day, 3-5 times a week for a duration of 6-12 weeks. Exercises that included:  Basic scapular stabilization to include adduction and depression of scapula Scaption, focusing on proper movement and good control Internal and External rotation utilizing a theraband, with elbow tucked at side entire time Rows with theraband   Proper technique shown and discussed handout in great detail with ATC.  All questions were discussed and answered.      Impression and Recommendations:     This case required medical decision making of moderate complexity. The above documentation has been reviewed and is accurate and complete Lyndal Pulley, DO       Note: This dictation was prepared with Dragon dictation along with smaller phrase technology. Any transcriptional errors that result from this process are unintentional.

## 2019-06-15 NOTE — Patient Instructions (Addendum)
Good to see you.  Ice 20 minutes 2 times daily. Usually after activity and before bed. Exercises 3 times a week.  Gabapentin 200 mg at night  Once weekly vitamin D for 12 weeks.  See me again in 5-6 weeks

## 2019-06-15 NOTE — Assessment & Plan Note (Signed)
New problem to [place] with patient.  Patient has known arthritic changes.  Concern for some spinal stenosis mostly at the C7-C8 with patient's weakness noted at that C8 distribution.  Started gabapentin that I think will be beneficial.  Vitamin D for muscle strength and endurance, work with athletic trainer to learn home exercises in greater detail, follow-up again in 4 to 8 weeks

## 2019-07-10 ENCOUNTER — Ambulatory Visit (AMBULATORY_SURGERY_CENTER): Payer: Self-pay | Admitting: *Deleted

## 2019-07-10 ENCOUNTER — Telehealth: Payer: Self-pay | Admitting: *Deleted

## 2019-07-10 ENCOUNTER — Other Ambulatory Visit: Payer: Self-pay

## 2019-07-10 VITALS — Temp 97.3°F | Ht 63.0 in | Wt 138.0 lb

## 2019-07-10 DIAGNOSIS — Z01818 Encounter for other preprocedural examination: Secondary | ICD-10-CM

## 2019-07-10 DIAGNOSIS — Z860100 Personal history of colon polyps, unspecified: Secondary | ICD-10-CM

## 2019-07-10 MED ORDER — SUTAB 1479-225-188 MG PO TABS: 1.0000 | ORAL_TABLET | ORAL | 0 refills | Status: DC

## 2019-07-10 NOTE — Progress Notes (Signed)
Patient is here in-person for PV. Patient denies any allergies to eggs or soy. Patient denies any problems with anesthesia/sedation. Patient denies any oxygen use at home. Patient denies taking any diet/weight loss medications or blood thinners. Patient is not being treated for MRSA or C-diff. EMMI education assisgned to the patient for the procedure, this was explained and instructions given to patient. COVID-19 screening test is on 4/7, the pt is aware.  Patient is aware of our care-partner policy and 0000000 safety protocol.   Sample Sutab 2day prep given to pt.   I faxed Dr.Mann's office release of info form signed by the pt.

## 2019-07-10 NOTE — Telephone Encounter (Signed)
Patient had PV today. She believes her last colonoscopy was with Dr.Mann in 2012, polyps "benign" per pt, but was told to repeat colon in 5 years. Release of information filled out and signed by the patient, faxed to Dr.Mann's office today. Release form given to [place], CMA.

## 2019-07-11 NOTE — Telephone Encounter (Signed)
I have refaxed [facility] as previous attempt to fax was unsuccessful.

## 2019-07-19 ENCOUNTER — Ambulatory Visit (INDEPENDENT_AMBULATORY_CARE_PROVIDER_SITE_OTHER): Payer: 59

## 2019-07-19 ENCOUNTER — Other Ambulatory Visit: Payer: Self-pay | Admitting: Internal Medicine

## 2019-07-19 DIAGNOSIS — Z1159 Encounter for screening for other viral diseases: Secondary | ICD-10-CM

## 2019-07-19 LAB — SARS CORONAVIRUS 2 (TAT 6-24 HRS): SARS Coronavirus 2: NEGATIVE

## 2019-07-20 ENCOUNTER — Encounter: Payer: Self-pay | Admitting: Internal Medicine

## 2019-07-24 ENCOUNTER — Ambulatory Visit (AMBULATORY_SURGERY_CENTER): Payer: 59 | Admitting: Internal Medicine

## 2019-07-24 ENCOUNTER — Encounter: Payer: Self-pay | Admitting: Internal Medicine

## 2019-07-24 ENCOUNTER — Other Ambulatory Visit: Payer: Self-pay

## 2019-07-24 VITALS — BP 116/86 | HR 73 | Temp 96.2°F | Resp 20 | Ht 63.0 in | Wt 138.0 lb

## 2019-07-24 DIAGNOSIS — Z86010 Personal history of colonic polyps: Secondary | ICD-10-CM | POA: Diagnosis present

## 2019-07-24 DIAGNOSIS — D124 Benign neoplasm of descending colon: Secondary | ICD-10-CM

## 2019-07-24 MED ORDER — SODIUM CHLORIDE 0.9 % IV SOLN: 500.0000 mL | Freq: Once | INTRAVENOUS | Status: DC

## 2019-07-24 NOTE — Progress Notes (Signed)
Called to room to assist during endoscopic procedure.  Patient ID and intended procedure confirmed with present staff. Received instructions for my participation in the procedure from the performing physician.  

## 2019-07-24 NOTE — Progress Notes (Signed)
To PACU, VSS. Report to [email or web address] 

## 2019-07-24 NOTE — Progress Notes (Signed)
Pt's states no medical or surgical changes since previsit or office visit.  Temp by LC.  VS by KA.

## 2019-07-24 NOTE — Patient Instructions (Signed)
HANDOUT ON POLYPS GIVEN TO YOU TODAY   AWAIT PATHOLOGY RESULTS ON POLYP REMOVED    HANDOUT ON HEMORRHOIDS GIVEN TO YOU TODAY   YOU HAD AN ENDOSCOPIC PROCEDURE TODAY AT [facility]:   Refer to the procedure report that was given to you for any specific questions about what was found during the examination.  If the procedure report does not answer your questions, please call your gastroenterologist to clarify.  If you requested that your care partner not be given the details of your procedure findings, then the procedure report has been included in a sealed envelope for you to review at your convenience later.  YOU SHOULD EXPECT: Some feelings of bloating in the abdomen. Passage of more gas than usual.  Walking can help get rid of the air that was put into your GI tract during the procedure and reduce the bloating. If you had a lower endoscopy (such as a colonoscopy or flexible sigmoidoscopy) you may notice spotting of blood in your stool or on the toilet paper. If you underwent a bowel prep for your procedure, you may not have a normal bowel movement for a few days.  Please Note:  You might notice some irritation and congestion in your nose or some drainage.  This is from the oxygen used during your procedure.  There is no need for concern and it should clear up in a day or so.  SYMPTOMS TO REPORT IMMEDIATELY:   Following lower endoscopy (colonoscopy or flexible sigmoidoscopy):  Excessive amounts of blood in the stool  Significant tenderness or worsening of abdominal pains  Swelling of the abdomen that is new, acute  Fever of 100F or higher  For urgent or emergent issues, a gastroenterologist can be reached at any hour by calling [phone]. Do not use MyChart messaging for urgent concerns.    DIET:  We do recommend a small meal at first, but then you may proceed to your regular diet.  Drink plenty of fluids but you should avoid alcoholic beverages for 24  hours.  ACTIVITY:  You should plan to take it easy for the rest of today and you should NOT DRIVE or use heavy machinery until tomorrow (because of the sedation medicines used during the test).    FOLLOW UP: Our staff will call the number listed on your records 48-72 hours following your procedure to check on you and address any questions or concerns that you may have regarding the information given to you following your procedure. If we do not reach you, we will leave a message.  We will attempt to reach you two times.  During this call, we will ask if you have developed any symptoms of COVID 19. If you develop any symptoms (ie: fever, flu-like symptoms, shortness of breath, cough etc.) before then, please call [phone].  If you test positive for Covid 19 in the 2 weeks post procedure, please call and report this information to [place].    If any biopsies were taken you will be contacted by phone or by letter within the next 1-3 weeks.  Please call us at [phone] if you have not heard about the biopsies in 3 weeks.    SIGNATURES/CONFIDENTIALITY: You and/or your care partner have signed paperwork which will be entered into your electronic medical record.  These signatures attest to the fact that that the information above on your After Visit Summary has been reviewed and is understood.  Full responsibility of the confidentiality of  this discharge information lies with you and/or your care-partner.

## 2019-07-24 NOTE — Op Note (Signed)
[facility] Patient Name: Nicole [name] Procedure Date: 07/24/2019 9:59 AM MRN: [id number] Endoscopist: Jerene Bears , MD Age: 54 Referring MD:  Date of Birth: Jul 10, 1965 Gender: Female Account #: [id number] Procedure:                Colonoscopy Indications:              High risk colon cancer surveillance: Personal                            history of colonic polyps, Last colonoscopy: 2012 Medicines:                Monitored Anesthesia Care Procedure:                Pre-Anesthesia Assessment:                           - Prior to the procedure, a History and Physical                            was performed, and patient medications and                            allergies were reviewed. The patient's tolerance of                            previous anesthesia was also reviewed. The risks                            and benefits of the procedure and the sedation                            options and risks were discussed with the patient.                            All questions were answered, and informed consent                            was obtained. Prior Anticoagulants: The patient has                            taken no previous anticoagulant or antiplatelet                            agents. ASA Grade Assessment: II - A patient with                            mild systemic disease. After reviewing the risks                            and benefits, the patient was deemed in                            satisfactory condition to undergo the procedure.  After obtaining informed consent, the colonoscope                            was passed under direct vision. Throughout the                            procedure, the patient's blood pressure, pulse, and                            oxygen saturations were monitored continuously. The                            Colonoscope was introduced through the anus and                            advanced to the  cecum, identified by appendiceal                            orifice and ileocecal valve. The colonoscopy was                            performed without difficulty. The patient tolerated                            the procedure well. The quality of the bowel                            preparation was excellent. The ileocecal valve,                            appendiceal orifice, and rectum were photographed. Scope In: 10:11:13 AM Scope Out: 10:28:50 AM Scope Withdrawal Time: 0 hours 14 minutes 9 seconds  Total Procedure Duration: 0 hours 17 minutes 37 seconds  Findings:                 The digital rectal exam was normal.                           A 4 mm polyp was found in the descending colon. The                            polyp was sessile. The polyp was removed with a                            cold snare. Resection and retrieval were complete.                           Internal hemorrhoids were found during                            retroflexion. The hemorrhoids were small.                           The exam was otherwise without abnormality. Complications:  No immediate complications. Estimated Blood Loss:     Estimated blood loss was minimal. Impression:               - One 4 mm polyp in the descending colon, removed                            with a cold snare. Resected and retrieved.                           - Small internal hemorrhoids.                           - The examination was otherwise normal. Recommendation:           - Patient has a contact number available for                            emergencies. The signs and symptoms of potential                            delayed complications were discussed with the                            patient. Return to normal activities tomorrow.                            Written discharge instructions were provided to the                            patient.                           - Resume previous diet.                            - Continue present medications.                           - Await pathology results.                           - Repeat colonoscopy is recommended for                            surveillance. The colonoscopy date will be                            determined after pathology results from today's                            exam become available for review. Jerene Bears, MD 07/24/2019 10:32:16 AM This report has been signed electronically.

## 2019-07-25 ENCOUNTER — Ambulatory Visit: Payer: 59 | Admitting: Family Medicine

## 2019-07-26 ENCOUNTER — Encounter: Payer: Self-pay | Admitting: Internal Medicine

## 2019-07-26 ENCOUNTER — Telehealth: Payer: Self-pay

## 2019-07-26 NOTE — Telephone Encounter (Signed)
  Follow up Call-  Call back number 07/24/2019  Post procedure Call Back phone  # [phone]  Permission to leave phone message Yes  Some recent data might be hidden     Patient questions:  Do you have a fever, pain , or abdominal swelling? No. Pain Score  0 *  Have you tolerated food without any problems? Yes.    Have you been able to return to your normal activities? Yes.    Do you have any questions about your discharge instructions: Diet   No. Medications  No. Follow up visit  No.  Do you have questions or concerns about your Care? No.  Actions: * If pain score is 4 or above: No action needed, pain <4.  1. Have you developed a fever since your procedure? no  2.   Have you had an respiratory symptoms (SOB or cough) since your procedure? no  3.   Have you tested positive for COVID 19 since your procedure no  4.   Have you had any family members/close contacts diagnosed with the COVID 19 since your procedure?  no   If yes to any of these questions please route to Joylene John, RN and Erenest Rasher, RN

## 2019-07-30 ENCOUNTER — Other Ambulatory Visit: Payer: Self-pay | Admitting: Family Medicine

## 2019-09-27 ENCOUNTER — Other Ambulatory Visit: Payer: Self-pay

## 2019-09-27 ENCOUNTER — Ambulatory Visit: Payer: 59 | Admitting: Family Medicine

## 2019-09-27 ENCOUNTER — Encounter: Payer: Self-pay | Admitting: Family Medicine

## 2019-09-27 ENCOUNTER — Telehealth: Payer: Self-pay | Admitting: Family Medicine

## 2019-09-27 VITALS — BP 140/84 | HR 91 | Ht 63.0 in | Wt 140.0 lb

## 2019-09-27 DIAGNOSIS — M999 Biomechanical lesion, unspecified: Secondary | ICD-10-CM | POA: Diagnosis not present

## 2019-09-27 DIAGNOSIS — M5030 Other cervical disc degeneration, unspecified cervical region: Secondary | ICD-10-CM

## 2019-09-27 MED ORDER — GABAPENTIN 100 MG PO CAPS: 200.0000 mg | ORAL_CAPSULE | Freq: Every day | ORAL | 0 refills | Status: DC

## 2019-09-27 MED ORDER — VITAMIN D (ERGOCALCIFEROL) 1.25 MG (50000 UNIT) PO CAPS: 50000.0000 [IU] | ORAL_CAPSULE | ORAL | 2 refills | Status: DC

## 2019-09-27 NOTE — Progress Notes (Signed)
[facility] [address] [place] Phone: [phone] Subjective:   I Nicole [name] am serving as a Education administrator for Dr. Hulan Saas.  This visit occurred during the SARS-CoV-2 public health emergency.  Safety protocols were in place, including screening questions prior to the visit, additional usage of staff PPE, and extensive cleaning of exam room while observing appropriate contact time as indicated for disinfecting solutions.   I'm seeing this patient by the request  of:  Patient, No Pcp Per  CC: Neck exam follow-up  [id number]  Nicole [name] is a 54 y.o. female coming in with complaint of back and neck pain. OMT 06/15/2019.  Patient states she is doing much better.  Patient has been doing the exercises.  No significant radicular symptoms at this time.  Medications patient has been prescribed: Gabapentin is helping         Reviewed prior external information including notes and imaging from previsou exam, outside providers and external EMR if available.   As well as notes that were available from care everywhere and other healthcare systems.  Past medical history, social, surgical and family history all reviewed in electronic medical record.  No pertanent information unless stated regarding to the chief complaint.   Past Medical History:  Diagnosis Date  . Nonallopathic lesion of lumbosacral region 06/15/2019    Allergies  Allergen Reactions  . Penicillins Hives     Review of Systems:  No headache, visual changes, nausea, vomiting, diarrhea, constipation, dizziness, abdominal pain, skin rash, fevers, chills, night sweats, weight loss, swollen lymph nodes, body aches, joint swelling, chest pain, shortness of breath, mood changes. POSITIVE muscle aches  Objective  Blood pressure 140/84, pulse 91, height 5\' 3"  (1.6 m), weight 140 lb (63.5 kg), SpO2 96 %.   General: No apparent distress alert and oriented x3 mood and affect normal,  dressed appropriately.  HEENT: Pupils equal, extraocular movements intact  Respiratory: Patient's speak in full sentences and does not appear short of breath  Cardiovascular: No lower extremity edema, non tender, no erythema  Neuro: Cranial nerves II through XII are intact, neurovascularly intact in all extremities with 2+ DTRs and 2+ pulses.  Gait normal with good balance and coordination.  MSK:  Non tender with full range of motion and good stability and symmetric strength and tone of shoulders, elbows, wrist, hip, knee and ankles bilaterally.  Neck exam does have some loss of lordosis.  Some tenderness to palpation in the paraspinal musculature of the lumbar spine right greater than left.  Patient also has some tenderness in the parascapular region of the neck bilaterally.  Negative Spurling's though.  Osteopathic findings  C2 flexed rotated and side bent right C7 flexed rotated and side bent left T3 extended rotated and side bent right inhaled rib T7 extended rotated and side bent left        Assessment and Plan:    Nonallopathic problems  Decision today to treat with OMT was based on Physical Exam  After verbal consent patient was treated with HVLA, ME, FPR techniques in cervical, rib, thoracic, lumbar, and sacral  areas  Patient tolerated the procedure well with improvement in symptoms  Patient given exercises, stretches and lifestyle modifications  See medications in patient instructions if given  Patient will follow up in 4-8 weeks      The above documentation has been reviewed and is accurate and complete Lyndal Pulley, DO       Note: This dictation  was prepared with Dragon dictation along with smaller phrase technology. Any transcriptional errors that result from this process are unintentional.

## 2019-09-27 NOTE — Addendum Note (Signed)
Addended by: Lyndal Pulley on: 09/27/2019 12:13 PM   Modules accepted: Orders

## 2019-09-27 NOTE — Telephone Encounter (Signed)
Patient said that she forgot to ask Dr Tamala Julian to refill her Gabapentin and Vitamin D while she was here for her appointment today. Can this be sent in for her?  Pharmacy: [place], [place]

## 2019-09-27 NOTE — Patient Instructions (Addendum)
Good to see you Overall significant improvement See me again 2 months

## 2019-09-27 NOTE — Assessment & Plan Note (Signed)
Patient's neck exam is doing relatively better.  X-rays did show degenerative disc disease moderate in severity.  Patient is doing well with the gabapentin.  Patient seems to be making progress.  Follow-up again in 4 to 8 weeks.

## 2019-12-01 ENCOUNTER — Ambulatory Visit: Payer: 59 | Admitting: Family Medicine

## 2019-12-01 ENCOUNTER — Other Ambulatory Visit: Payer: Self-pay

## 2019-12-01 ENCOUNTER — Encounter: Payer: Self-pay | Admitting: Family Medicine

## 2019-12-01 VITALS — BP 134/86 | HR 78 | Ht 63.0 in | Wt 131.0 lb

## 2019-12-01 DIAGNOSIS — M999 Biomechanical lesion, unspecified: Secondary | ICD-10-CM | POA: Diagnosis not present

## 2019-12-01 DIAGNOSIS — M5030 Other cervical disc degeneration, unspecified cervical region: Secondary | ICD-10-CM | POA: Diagnosis not present

## 2019-12-01 NOTE — Assessment & Plan Note (Addendum)
Stable overall, responding extremely well to the osteopathic manipulation.  Discussed posture and ergonomics, discussed which activities to do which was to avoid.  Increase activity slowly.  Patient will continue the gabapentin which is helping very well at this time.  Patient has improved range of motion.  Follow-up with me again in 3 months

## 2019-12-01 NOTE — Patient Instructions (Signed)
Refilled gabapentin and vitamin D Make daughter carry shopping bags See me again in 3 months

## 2019-12-01 NOTE — Progress Notes (Signed)
[facility] [address] [place] Phone: [phone] Subjective:   I Nicole [name] am serving as a Education administrator for Dr. Hulan Saas.  This visit occurred during the SARS-CoV-2 public health emergency.  Safety protocols were in place, including screening questions prior to the visit, additional usage of staff PPE, and extensive cleaning of exam room while observing appropriate contact time as indicated for disinfecting solutions.   I'm seeing this patient by the request  of:  Patient, No Pcp Per  CC: Neck pain follow-up  [id number]  Anasha Wunder is a 54 y.o. female coming in with complaint of back and neck pain. OMT 616/2021. Patient states she is feeling about the same. Somewhat improved. States she has gone back to work a couple days a week.   Medications patient has been prescribed: Gabapentin and vitamin D  Taking: Yes         Reviewed prior external information including notes and imaging from previsou exam, outside providers and external EMR if available.   As well as notes that were available from care everywhere and other healthcare systems.  Past medical history, social, surgical and family history all reviewed in electronic medical record.  No pertanent information unless stated regarding to the chief complaint.   Past Medical History:  Diagnosis Date  . Nonallopathic lesion of lumbosacral region 06/15/2019    Allergies  Allergen Reactions  . Penicillins Hives     Review of Systems:  No headache, visual changes, nausea, vomiting, diarrhea, constipation, dizziness, abdominal pain, skin rash, fevers, chills, night sweats, weight loss, swollen lymph nodes, body aches, joint swelling, chest pain, shortness of breath, mood changes. POSITIVE muscle aches  Objective  Blood pressure 134/86, pulse 78, height 5\' 3"  (1.6 m), weight 131 lb (59.4 kg), SpO2 98 %.   General: No apparent distress alert and oriented x3 mood and  affect normal, dressed appropriately.  HEENT: Pupils equal, extraocular movements intact  Respiratory: Patient's speak in full sentences and does not appear short of breath  Cardiovascular: No lower extremity edema, non tender, no erythema  Neuro: Cranial nerves II through XII are intact, neurovascularly intact in all extremities with 2+ DTRs and 2+ pulses.  Gait normal with good balance and coordination.  MSK:  Non tender with full range of motion and good stability and symmetric strength and tone of shoulders, elbows, wrist, hip, knee and ankles bilaterally.  Neck exam actually shows some improvement in range of motion.  Still has some tenderness to palpation of the paraspinal musculature Osteopathic findings  C2 flexed rotated and side bent right C6 flexed rotated and side bent left T9 extended rotated and side bent left L4 flexed rotated and side bent left        Assessment and Plan: Degenerative disc disease, cervical Stable overall, responding extremely well to the osteopathic manipulation.  Discussed posture and ergonomics, discussed which activities to do which was to avoid.  Increase activity slowly.     Nonallopathic problems  Decision today to treat with OMT was based on Physical Exam  After verbal consent patient was treated with HVLA, ME, FPR techniques in cervical, , thoracic, lumbar areas  Patient tolerated the procedure well with improvement in symptoms  Patient given exercises, stretches and lifestyle modifications  See medications in patient instructions if given  Patient will follow up in 4-8 weeks      The above documentation has been reviewed and is accurate and complete Lyndal Pulley, DO  Note: This dictation was prepared with Dragon dictation along with smaller phrase technology. Any transcriptional errors that result from this process are unintentional.

## 2019-12-29 ENCOUNTER — Other Ambulatory Visit: Payer: Self-pay | Admitting: Family Medicine

## 2020-03-04 NOTE — Progress Notes (Signed)
[facility] [place] Phone: [phone] Subjective:   Nicole Nicole [name], am serving as a scribe for Dr. Hulan Saas. This visit occurred during the SARS-CoV-2 public health emergency.  Safety protocols were in place, including screening questions prior to the visit, additional usage of staff PPE, and extensive cleaning of exam room while observing appropriate contact time as indicated for disinfecting solutions.   I'm seeing this patient by the request  of:  Patient, Nicole [name] Pcp Per  CC: Neck pain follow-up, mild increase in lower back pain  [id number]  Nicole Nicole [name] is a 54 y.o. female coming in with complaint of back and neck pain. OMT 12/01/2019. Patient states that she has been taking gabapentin which is helping her back pain. Is walking on treadclimber and is having burning sensation over lateral hip on left side.   Medications patient has been prescribed: Gabapentin  Taking: Yes as well as vitamin D         Reviewed prior external information including notes and imaging from previsou exam, outside providers and external EMR if available.   As well as notes that were available from care everywhere and other healthcare systems.  Past medical history, social, surgical and family history all reviewed in electronic medical record.  Nicole [name] pertanent information unless stated regarding to the chief complaint.   Past Medical History:  Diagnosis Date   Nonallopathic lesion of lumbosacral region 06/15/2019    Allergies  Allergen Reactions   Penicillins Hives     Review of Systems:  Nicole [name] headache, visual changes, nausea, vomiting, diarrhea, constipation, dizziness, abdominal pain, skin rash, fevers, chills, night sweats, weight loss, swollen lymph nodes, body aches, joint swelling, chest pain, shortness of breath, mood changes. POSITIVE muscle aches  Objective  Blood pressure 122/86, pulse 79, height 5\' 3"  (1.6 m), weight 133  lb (60.3 kg), SpO2 98 %.   General: Nicole [name] apparent distress alert and oriented x3 mood and affect normal, dressed appropriately.  HEENT: Pupils equal, extraocular movements intact  Respiratory: Patient's speak in full sentences and does not appear short of breath  Cardiovascular: Nicole [name] lower extremity edema, non tender, Nicole [name] erythema  Neuro: Cranial nerves II through XII are intact, neurovascularly intact in all extremities with 2+ DTRs and 2+ pulses.  Gait normal with good balance and coordination.   Neck exam does have some mild loss of lordosis.  Some negative Spurling's though noted today.  Patient does have some mild tightness and some mild impingement noted of the right shoulder with Hawkins.  Patient's right hip mild tenderness to palpation over the greater trochanteric area.  Osteopathic findings  C2 flexed rotated and side bent right C6 flexed rotated and side bent left T3 extended rotated and side bent right inhaled rib T9 extended rotated and side bent left L5 flexed rotated and side bent right        Assessment and Plan:  Degenerative disc disease, cervical Stable overall.  Patient does have some more tightness noted on the posterior aspect of the right shoulder today.  Discussed posture, discussed injections today which wants to avoid.  Patient is to increase activity slowly.  Discussed medication for this chronic problem with mild exacerbation of the gabapentin.  Continue the vitamin D.  Follow-up again in 2 to 3 months    Nonallopathic problems  Decision today to treat with OMT was based on Physical Exam  After verbal consent patient was treated with HVLA, ME, FPR techniques in cervical,  rib, thoracic, lumbar areas  Patient tolerated the procedure well with improvement in symptoms  Patient given exercises, stretches and lifestyle modifications  See medications in patient instructions if given  Patient will follow up in 8 weeks      The above documentation has  been reviewed and is accurate and complete Lyndal Pulley, DO       Note: This dictation was prepared with Dragon dictation along with smaller phrase technology. Any transcriptional errors that result from this process are unintentional.

## 2020-03-05 ENCOUNTER — Encounter: Payer: Self-pay | Admitting: Family Medicine

## 2020-03-05 ENCOUNTER — Other Ambulatory Visit: Payer: Self-pay

## 2020-03-05 ENCOUNTER — Ambulatory Visit: Payer: 59 | Admitting: Family Medicine

## 2020-03-05 VITALS — BP 122/86 | HR 79 | Ht 63.0 in | Wt 133.0 lb

## 2020-03-05 DIAGNOSIS — M999 Biomechanical lesion, unspecified: Secondary | ICD-10-CM

## 2020-03-05 DIAGNOSIS — M5030 Other cervical disc degeneration, unspecified cervical region: Secondary | ICD-10-CM | POA: Diagnosis not present

## 2020-03-05 MED ORDER — VITAMIN D (ERGOCALCIFEROL) 1.25 MG (50000 UNIT) PO CAPS: 50000.0000 [IU] | ORAL_CAPSULE | ORAL | 0 refills | Status: DC

## 2020-03-05 MED ORDER — GABAPENTIN 100 MG PO CAPS: 200.0000 mg | ORAL_CAPSULE | Freq: Every day | ORAL | 0 refills | Status: DC

## 2020-03-05 NOTE — Assessment & Plan Note (Signed)
Stable overall.  Patient does have some more tightness noted on the posterior aspect of the right shoulder today.  Discussed posture, discussed injections today which wants to avoid.  Patient is to increase activity slowly.  Discussed medication for this chronic problem with mild exacerbation of the gabapentin.  Continue the vitamin D.  Follow-up again in 2 to 3 months

## 2020-03-05 NOTE — Patient Instructions (Signed)
Gabapentin Vit D Keep hands in peripheral vision See me in 2-3 months

## 2020-04-27 ENCOUNTER — Other Ambulatory Visit: Payer: Self-pay | Admitting: Obstetrics and Gynecology

## 2020-04-27 DIAGNOSIS — Z780 Asymptomatic menopausal state: Secondary | ICD-10-CM

## 2020-05-06 ENCOUNTER — Ambulatory Visit: Payer: 59 | Admitting: Family Medicine

## 2020-06-24 ENCOUNTER — Telehealth: Payer: Self-pay | Admitting: Family Medicine

## 2020-06-24 ENCOUNTER — Other Ambulatory Visit: Payer: Self-pay

## 2020-06-24 ENCOUNTER — Other Ambulatory Visit: Payer: Self-pay | Admitting: Family Medicine

## 2020-06-24 MED ORDER — GABAPENTIN 100 MG PO CAPS: 200.0000 mg | ORAL_CAPSULE | Freq: Every day | ORAL | 2 refills | Status: DC

## 2020-06-24 NOTE — Telephone Encounter (Signed)
Rx called in . Patient notified

## 2020-06-24 NOTE — Telephone Encounter (Signed)
Patient called requesting a refill on gabapentin (NEURONTIN) 100 MG capsule. She is scheduled with Dr Tamala Julian for a follow up on 07/10/2020.

## 2020-07-09 NOTE — Progress Notes (Signed)
[employer] [place] Phone: [phone] Subjective:   [name], am serving as a scribe for Dr. Hulan Saas. This visit occurred during the SARS-CoV-2 public health emergency.  Safety protocols were in place, including screening questions prior to the visit, additional usage of staff PPE, and extensive cleaning of exam room while observing appropriate contact time as indicated for disinfecting solutions.   I'm seeing this patient by the request  of:  Patient, No Pcp Per (Inactive)  CC: Neck and back pain  [id number]  Naudia Noga is a 55 y.o. female coming in with complaint of back and neck pain. OMT 03/05/2020. Patient states that her neck is bothering her more than usual.  Has noticed more tightness.  Has been 4 months since we have seen her.  Nothing severe though at this time.  Shows worsening of her previous symptoms.  Medications patient has been prescribed: Gabapentin, Vit D Taking: Yes gabapentin         Reviewed prior external information including notes and imaging from previsou exam, outside providers and external EMR if available.   As well as notes that were available from care everywhere and other healthcare systems.  Past medical history, social, surgical and family history all reviewed in electronic medical record.  No pertanent information unless stated regarding to the chief complaint.   Past Medical History:  Diagnosis Date  . Nonallopathic lesion of lumbosacral region 06/15/2019    Allergies  Allergen Reactions  . Penicillins Hives     Review of Systems:  No headache, visual changes, nausea, vomiting, diarrhea, constipation, dizziness, abdominal pain, skin rash, fevers, chills, night sweats, weight loss, swollen lymph nodes, body aches, joint swelling, chest pain, shortness of breath, mood changes. POSITIVE muscle aches  Objective  Blood pressure 138/86, pulse (!) 101, height 5\' 3"   (1.6 m), weight 133 lb (60.3 kg), SpO2 99 %.   General: No apparent distress alert and oriented x3 mood and affect normal, dressed appropriately.  HEENT: Pupils equal, extraocular movements intact  Respiratory: Patient's speak in full sentences and does not appear short of breath  Cardiovascular: No lower extremity edema, non tender, no erythema  Neck exam does have some mild loss of lordosis.  Some tenderness to palpation in the paraspinal musculature of the neck as well as in the thoracic area.  Patient's low back does have some very mild tenderness noted.  5-5 strength of the upper extremities.  Osteopathic findings  C2 flexed rotated and side bent right C4 flexed rotated and side bent left C6 flexed rotated and side bent left T5 extended rotated and side bent right inhaled rib T9 extended rotated and side bent left        Assessment and Plan: Degenerative disc disease, cervical Chronic problem overall mild exacerbation.  Discussed the gabapentin.  Continue the same dose.  We do have room to increase if necessary.  Discussed icing regimen and home exercises.  Patient will follow up with me again     Nonallopathic problems  Decision today to treat with OMT was based on Physical Exam  After verbal consent patient was treated with HVLA, ME, FPR techniques in cervical, rib, thoracic areas  Patient tolerated the procedure well with improvement in symptoms  Patient given exercises, stretches and lifestyle modifications  See medications in patient instructions if given  Patient will follow up in 4-8 weeks      The above documentation has been reviewed and is  accurate and complete Lyndal Pulley, DO       Note: This dictation was prepared with Dragon dictation along with smaller phrase technology. Any transcriptional errors that result from this process are unintentional.

## 2020-07-10 ENCOUNTER — Other Ambulatory Visit: Payer: Self-pay

## 2020-07-10 ENCOUNTER — Ambulatory Visit: Payer: 59 | Admitting: Family Medicine

## 2020-07-10 ENCOUNTER — Encounter: Payer: Self-pay | Admitting: Family Medicine

## 2020-07-10 VITALS — BP 138/86 | HR 101 | Ht 63.0 in | Wt 133.0 lb

## 2020-07-10 DIAGNOSIS — M999 Biomechanical lesion, unspecified: Secondary | ICD-10-CM

## 2020-07-10 DIAGNOSIS — M5030 Other cervical disc degeneration, unspecified cervical region: Secondary | ICD-10-CM | POA: Diagnosis not present

## 2020-07-10 MED ORDER — GABAPENTIN 100 MG PO CAPS: 200.0000 mg | ORAL_CAPSULE | Freq: Every day | ORAL | 0 refills | Status: DC

## 2020-07-10 NOTE — Assessment & Plan Note (Signed)
Chronic problem overall mild exacerbation.  Discussed the gabapentin.  Continue the same dose.  We do have room to increase if necessary.  Discussed icing regimen and home exercises.  Patient will follow up with me again

## 2020-07-10 NOTE — Patient Instructions (Signed)
Gabapentin refilled Monitor at eye level See me in 2 months

## 2020-09-06 NOTE — Progress Notes (Signed)
[place] Phone: [phone] Subjective:   Nicole Nicole [name], am serving as a scribe for Dr. Hulan Saas. This visit occurred during the SARS-CoV-2 public health emergency.  Safety protocols were in place, including screening questions prior to the visit, additional usage of staff PPE, and extensive cleaning of exam room while observing appropriate contact time as indicated for disinfecting solutions.   I'm seeing this patient by the request  of:  Patient, Nicole [name] Pcp Per (Inactive)  CC: Neck and back pain follow-up  [id number]  Nicole Nicole [name] is a 55 y.o. female coming in with complaint of back and neck pain. OMT 07/10/2020. Patient states that she has not felt any new symptoms since last visit. Continues to have tingling in upper back but it is not any worse than before.    Medications patient has been prescribed: Gabapentin, Vit  D          Reviewed prior external information including notes and imaging from previsou exam, outside providers and external EMR if available.   As well as notes that were available from care everywhere and other healthcare systems.  Past medical history, social, surgical and family history all reviewed in electronic medical record.  Nicole [name] pertanent information unless stated regarding to the chief complaint.   Past Medical History:  Diagnosis Date  . Nonallopathic lesion of lumbosacral region 06/15/2019    Allergies  Allergen Reactions  . Penicillins Hives     Review of Systems:  Nicole [name] headache, visual changes, nausea, vomiting, diarrhea, constipation, dizziness, abdominal pain, skin rash, fevers, chills, night sweats, weight loss, swollen lymph nodes, body aches, joint swelling, chest pain, shortness of breath, mood changes. POSITIVE muscle aches  Objective  Blood pressure (!) 142/82, pulse 80, height 5\' 3"  (1.6 m), weight 134 lb (60.8 kg), SpO2 98 %.   General: Nicole [name] apparent distress  alert and oriented x3 mood and affect normal, dressed appropriately.  HEENT: Pupils equal, extraocular movements intact  Respiratory: Patient's speak in full sentences and does not appear short of breath  Cardiovascular: Nicole [name] lower extremity edema, non tender, Nicole [name] erythema  Gait normal with good balance and coordination.  MSK:  Non tender with full range of motion and good stability and symmetric strength and tone of shoulders, elbows, wrist, hip, knee and ankles bilaterally.  Back -tightness noted in the upper back.  Seems to be more on the left greater than the right today.  Neck exam does have some mild loss of lordosis.  Some mild crepitus noted with sidebending.  Tightness with left-sided rotation as well.  Osteopathic findings  C6 flexed rotated and side bent left C7 flexed rotated and side bent left T3 extended rotated and side bent right T9 extended rotated and side bent left  L3 flexed rotated and side bent right      Assessment and Plan:  Degenerative disc disease, cervical Chronic problem and stable.  Responded extremely well to osteopathic manipulation again today.  Patient is on vitamin D as well as the gabapentin.  Patient has done relatively well.  We discussed with patient to continue to work on the home exercises including the scapular stabilization.  Follow-up with me again in 6 to 8 weeks    Nonallopathic problems  Decision today to treat with OMT was based on Physical Exam  After verbal consent patient was treated with HVLA, ME, FPR techniques in cervical, lumbar, thoracic,l  areas  Patient tolerated the procedure well with  improvement in symptoms  Patient given exercises, stretches and lifestyle modifications  See medications in patient instructions if given  Patient will follow up in 6-8 weeks      The above documentation has been reviewed and is accurate and complete Lyndal Pulley, DO       Note: This dictation was prepared with Dragon dictation  along with smaller phrase technology. Any transcriptional errors that result from this process are unintentional.

## 2020-09-10 ENCOUNTER — Other Ambulatory Visit: Payer: Self-pay

## 2020-09-10 ENCOUNTER — Ambulatory Visit: Payer: 59 | Admitting: Family Medicine

## 2020-09-10 ENCOUNTER — Encounter: Payer: Self-pay | Admitting: Family Medicine

## 2020-09-10 VITALS — BP 142/82 | HR 80 | Ht 63.0 in | Wt 134.0 lb

## 2020-09-10 DIAGNOSIS — M9901 Segmental and somatic dysfunction of cervical region: Secondary | ICD-10-CM

## 2020-09-10 DIAGNOSIS — M9903 Segmental and somatic dysfunction of lumbar region: Secondary | ICD-10-CM

## 2020-09-10 DIAGNOSIS — M9902 Segmental and somatic dysfunction of thoracic region: Secondary | ICD-10-CM

## 2020-09-10 DIAGNOSIS — M5030 Other cervical disc degeneration, unspecified cervical region: Secondary | ICD-10-CM

## 2020-09-10 NOTE — Patient Instructions (Signed)
Great to see you as always Consider a tennis ball to back shoulder blade on left when you feel tightness See me again in 8 weeks

## 2020-09-10 NOTE — Assessment & Plan Note (Signed)
Chronic problem and stable.  Responded extremely well to osteopathic manipulation again today.  Patient is on vitamin D as well as the gabapentin.  Patient has done relatively well.  We discussed with patient to continue to work on the home exercises including the scapular stabilization.  Follow-up with me again in 6 to 8 weeks

## 2020-09-21 ENCOUNTER — Other Ambulatory Visit: Payer: Self-pay | Admitting: Family Medicine

## 2020-10-30 NOTE — Progress Notes (Signed)
  [place] [address] [place] Phone: [phone] Subjective:   I Nicole [name] am serving as a Education administrator for Dr. Hulan Saas.  This visit occurred during the SARS-CoV-2 public health emergency.  Safety protocols were in place, including screening questions prior to the visit, additional usage of staff PPE, and extensive cleaning of exam room while observing appropriate contact time as indicated for disinfecting solutions.   I'm seeing this patient by the request  of:  Patient, No Pcp Per (Inactive)  CC: back and neck pain   [id number]  Nicole [name] is a 55 y.o. female coming in with complaint of back and neck pain. OMT 09/10/2020. Patient states she feels about the same. Having more pain in the left scapula last week that has gotten better.   Medications patient has been prescribed: Vit D, Gabapentin  Taking:         Past Medical History:  Diagnosis Date   Nonallopathic lesion of lumbosacral region 06/15/2019    Allergies  Allergen Reactions   Penicillins Hives     Review of Systems:  No headache, visual changes, nausea, vomiting, diarrhea, constipation, dizziness, abdominal pain, skin rash, fevers, chills, night sweats, weight loss, swollen lymph nodes, body aches, joint swelling, chest pain, shortness of breath, mood changes. POSITIVE muscle aches  Objective  Blood pressure 130/90, pulse 80, height 5\' 3"  (1.6 m), weight 135 lb (61.2 kg), SpO2 99 %.   General: No apparent distress alert and oriented x3 mood and affect normal, dressed appropriately.  HEENT: Pupils equal, extraocular movements intact  Respiratory: Patient's speak in full sentences and does not appear short of breath  Cardiovascular: No lower extremity edema, non tender, no erythema    Osteopathic findings C6 flexed rotated and side bent left T3 extended rotated and side bent left inhaled rib T7 extended rotated and side bent left L2 flexed rotated  and side bent right Sacrum right on right      Assessment and Plan:  Degenerative disc disease, cervical Tightness and limited in where the left scapular region today.  Patient is going to continue with the same exercises.  Responding well to the gabapentin as well.  No significant radicular symptoms.  Discussed icing regimen and home exercises.  Increase activity slowly.  Follow-up again in 6 to 8 weeks   Nonallopathic problems  Decision today to treat with OMT was based on Physical Exam  After verbal consent patient was treated with HVLA, ME, FPR techniques in cervical, rib, thoracic, lumbar, and sacral  areas  Patient tolerated the procedure well with improvement in symptoms  Patient given exercises, stretches and lifestyle modifications  See medications in patient instructions if given  Patient will follow up in 8 weeks      The above documentation has been reviewed and is accurate and complete Lyndal Pulley, DO        Note: This dictation was prepared with Dragon dictation along with smaller phrase technology. Any transcriptional errors that result from this process are unintentional.

## 2020-11-01 ENCOUNTER — Ambulatory Visit: Payer: 59 | Admitting: Family Medicine

## 2020-11-04 ENCOUNTER — Other Ambulatory Visit: Payer: Self-pay

## 2020-11-04 ENCOUNTER — Ambulatory Visit: Payer: 59 | Admitting: Family Medicine

## 2020-11-04 ENCOUNTER — Encounter: Payer: Self-pay | Admitting: Family Medicine

## 2020-11-04 VITALS — BP 130/90 | HR 80 | Ht 63.0 in | Wt 135.0 lb

## 2020-11-04 DIAGNOSIS — M9902 Segmental and somatic dysfunction of thoracic region: Secondary | ICD-10-CM

## 2020-11-04 DIAGNOSIS — M5030 Other cervical disc degeneration, unspecified cervical region: Secondary | ICD-10-CM

## 2020-11-04 DIAGNOSIS — M9903 Segmental and somatic dysfunction of lumbar region: Secondary | ICD-10-CM | POA: Diagnosis not present

## 2020-11-04 DIAGNOSIS — M9901 Segmental and somatic dysfunction of cervical region: Secondary | ICD-10-CM | POA: Diagnosis not present

## 2020-11-04 DIAGNOSIS — M9908 Segmental and somatic dysfunction of rib cage: Secondary | ICD-10-CM | POA: Diagnosis not present

## 2020-11-04 DIAGNOSIS — M9904 Segmental and somatic dysfunction of sacral region: Secondary | ICD-10-CM | POA: Diagnosis not present

## 2020-11-04 MED ORDER — GABAPENTIN 100 MG PO CAPS: 200.0000 mg | ORAL_CAPSULE | Freq: Every day | ORAL | 0 refills | Status: DC

## 2020-11-04 NOTE — Patient Instructions (Signed)
Good to see you Reflill gabapentin Keep doing exercises See me again in 2 months

## 2020-11-04 NOTE — Assessment & Plan Note (Signed)
Tightness and limited in where the left scapular region today.  Patient is going to continue with the same exercises.  Responding well to the gabapentin as well.  No significant radicular symptoms.  Discussed icing regimen and home exercises.  Increase activity slowly.  Follow-up again in 6 to 8 weeks

## 2020-12-20 ENCOUNTER — Other Ambulatory Visit: Payer: Self-pay | Admitting: Family Medicine

## 2021-01-06 ENCOUNTER — Ambulatory Visit: Payer: 59 | Admitting: Family Medicine

## 2021-01-07 NOTE — Progress Notes (Signed)
[place] Phone: [phone] Subjective:   Nicole [name], am serving as a scribe for Dr. Hulan Saas. This visit occurred during the SARS-CoV-2 public health emergency.  Safety protocols were in place, including screening questions prior to the visit, additional usage of staff PPE, and extensive cleaning of exam room while observing appropriate contact time as indicated for disinfecting solutions.   I'm seeing this patient by the request  of:  Patient, [name] Pcp Per (Inactive)  CC: Back and neck pain follow-up  [id number]  Nicole [name] is a 55 y.o. female coming in with complaint of back and neck pain. OMT 11/04/2020.  Patient states that she is having R shoulder pain in posterior aspect. Neck numbness is intermittent. Patient has not been able to exercising due to taking care of alleging mother who did pass away.  Patient states has not been able to attend to focus on herself.  Medications patient has been prescribed: Vit D            Past Medical History:  Diagnosis Date   Nonallopathic lesion of lumbosacral region 06/15/2019    Allergies  Allergen Reactions   Penicillins Hives     Review of Systems:  [name] headache, visual changes, nausea, vomiting, diarrhea, constipation, dizziness, abdominal pain, skin rash, fevers, chills, night sweats, weight loss, swollen lymph nodes, body aches, joint swelling, chest pain, shortness of breath, mood changes. POSITIVE muscle aches  Objective  Blood pressure 132/90, pulse 75, height 5\' 3"  (1.6 m), weight 135 lb (61.2 kg), SpO2 99 %.   General: [name] apparent distress alert and oriented x3 mood and affect normal, dressed appropriately.  Patient is tearful HEENT: Pupils equal, extraocular movements intact  Respiratory: Patient's speak in full sentences and does not appear short of breath  Cardiovascular: [name] lower extremity edema, non tender, [name] erythema  Neck exam does  have some loss of lordosis.  Tightness noted in the parascapular region right greater than left.  Patient also has some limited sidebending on the right side of the neck.  5 out of 5 strength of the upper extremities though. Patient does have some tightness also noted in the thoracolumbar juncture and mild tightness noted in the sacroiliac area right greater than left.  Negative straight leg test.   Osteopathic findings  C4 flexed rotated and side bent left T4 extended rotated and side bent right inhaled rib T7 extended rotated and side bent left L2 flexed rotated and side bent right Sacrum right on right       Assessment and Plan:  Degenerative disc disease, cervical Degenerative disc disease.  Does respond well though to osteopathic manipulation.  We will control chronic problem with exacerbation.  Does have the gabapentin which we refilled.  Having difficulty with sleep likely secondary to some of the passing of the mother as well as some of the chronic discomfort.  Patient did respond well though to the manipulation as stated above.  Follow-up with me again in 4-6 weeks   Nonallopathic problems  Decision today to treat with OMT was based on Physical Exam  After verbal consent patient was treated with HVLA, ME, FPR techniques in cervical, rib, thoracic, lumbar, and sacral  areas  Patient tolerated the procedure well with improvement in symptoms  Patient given exercises, stretches and lifestyle modifications  See medications in patient instructions if given  Patient will follow up in 4 weeks      The above  documentation has been reviewed and is accurate and complete Lyndal Pulley, DO        Note: This dictation was prepared with Dragon dictation along with smaller phrase technology. Any transcriptional errors that result from this process are unintentional.

## 2021-01-08 ENCOUNTER — Other Ambulatory Visit: Payer: Self-pay

## 2021-01-08 ENCOUNTER — Encounter: Payer: Self-pay | Admitting: Family Medicine

## 2021-01-08 ENCOUNTER — Ambulatory Visit: Payer: 59 | Admitting: Family Medicine

## 2021-01-08 DIAGNOSIS — M5030 Other cervical disc degeneration, unspecified cervical region: Secondary | ICD-10-CM | POA: Diagnosis not present

## 2021-01-08 DIAGNOSIS — M999 Biomechanical lesion, unspecified: Secondary | ICD-10-CM | POA: Diagnosis not present

## 2021-01-08 MED ORDER — GABAPENTIN 100 MG PO CAPS: 200.0000 mg | ORAL_CAPSULE | Freq: Every day | ORAL | 0 refills | Status: DC

## 2021-01-08 NOTE — Assessment & Plan Note (Signed)
Degenerative disc disease.  Does respond well though to osteopathic manipulation.  We will control chronic problem with exacerbation.  Does have the gabapentin which we refilled.  Having difficulty with sleep likely secondary to some of the passing of the mother as well as some of the chronic discomfort.  Patient did respond well though to the manipulation as stated above.  Follow-up with me again in 4-6 weeks

## 2021-01-08 NOTE — Patient Instructions (Signed)
Sorry for your loss Refilled gabapentin See me in 4-6 weeks

## 2021-02-05 ENCOUNTER — Ambulatory Visit: Payer: 59 | Admitting: Family Medicine

## 2021-03-10 ENCOUNTER — Ambulatory Visit: Payer: 59 | Admitting: Sports Medicine

## 2021-03-26 ENCOUNTER — Other Ambulatory Visit: Payer: Self-pay | Admitting: Family Medicine

## 2021-04-16 LAB — HM PAP SMEAR: HM Pap smear: NEGATIVE

## 2021-04-30 ENCOUNTER — Other Ambulatory Visit: Payer: Self-pay | Admitting: Obstetrics and Gynecology

## 2021-04-30 DIAGNOSIS — Z8262 Family history of osteoporosis: Secondary | ICD-10-CM

## 2021-05-17 ENCOUNTER — Other Ambulatory Visit: Payer: Self-pay | Admitting: Family Medicine

## 2021-06-21 ENCOUNTER — Other Ambulatory Visit: Payer: Self-pay | Admitting: Family Medicine

## 2021-08-22 ENCOUNTER — Other Ambulatory Visit: Payer: Self-pay | Admitting: Family Medicine

## 2021-10-02 ENCOUNTER — Ambulatory Visit
Admission: RE | Admit: 2021-10-02 | Discharge: 2021-10-02 | Disposition: A | Discharge status: Home / Self Care | Payer: 59 | Source: Ambulatory Visit | Attending: Obstetrics and Gynecology | Admitting: Obstetrics and Gynecology

## 2021-10-02 DIAGNOSIS — Z8262 Family history of osteoporosis: Secondary | ICD-10-CM

## 2021-10-04 ENCOUNTER — Other Ambulatory Visit: Payer: Self-pay | Admitting: Family Medicine

## 2021-11-27 ENCOUNTER — Other Ambulatory Visit: Payer: Self-pay | Admitting: Family Medicine

## 2021-12-08 NOTE — Progress Notes (Unsigned)
  [place] Phone: [phone] Subjective:   Nicole [name], am serving as a scribe for Dr. Hulan Saas.  I'm seeing this patient by the request  of:  Patient, [name] Pcp Per  CC: neck and back pain   [id number]  Nicole [name] is a 56 y.o. female coming in with complaint of back and neck pain. OMT 01/08/2021. Patient states that she is having similar pain in her upper back and neck as last year.   Medications patient has been prescribed: Vit D, Gabapentin  Taking:         Reviewed prior external information including notes and imaging from previsou exam, outside providers and external EMR if available.   As well as notes that were available from care everywhere and other healthcare systems.  Past medical history, social, surgical and family history all reviewed in electronic medical record.  [name] pertanent information unless stated regarding to the chief complaint.   Past Medical History:  Diagnosis Date   Nonallopathic lesion of lumbosacral region 06/15/2019    Allergies  Allergen Reactions   Penicillins Hives     Review of Systems:  [name] headache, visual changes, nausea, vomiting, diarrhea, constipation, dizziness, abdominal pain, skin rash, fevers, chills, night sweats, weight loss, swollen lymph nodes, body aches, joint swelling, chest pain, shortness of breath, mood changes. POSITIVE muscle aches  Objective  Blood pressure 110/86, pulse 81, height '5\' 3"'$  (1.6 m), SpO2 98 %.   General: [name] apparent distress alert and oriented x3 mood and affect normal, dressed appropriately.  HEENT: Pupils equal, extraocular movements intact  Respiratory: Patient's speak in full sentences and does not appear short of breath  Cardiovascular: [name] lower extremity edema, non tender, [name] erythema  Gait MSK:  Back   Osteopathic findings  C2 flexed rotated and side bent right C4 flexed rotated and side bent left C6  flexed rotated and side bent left T3 extended rotated and side bent right inhaled rib T8 extended rotated and side bent left L2 flexed rotated and side bent right Sacrum right on right       Assessment and Plan:  Degenerative disc disease, cervical DDD Discussed HEP  Discussed which activities  Worsening symptoms, refill gabapentin and vitamin D.  Responds well to manipulation.  Follow-up again in 6 to 8 weeks    Nonallopathic problems  Decision today to treat with OMT was based on Physical Exam  After verbal consent patient was treated with HVLA, ME, FPR techniques in cervical, rib, thoracic, lumbar, and sacral  areas  Patient tolerated the procedure well with improvement in symptoms  Patient given exercises, stretches and lifestyle modifications  See medications in patient instructions if given  Patient will follow up in 4-8 weeks      The above documentation has been reviewed and is accurate and complete Lyndal Pulley, DO        Note: This dictation was prepared with Dragon dictation along with smaller phrase technology. Any transcriptional errors that result from this process are unintentional.

## 2021-12-09 ENCOUNTER — Ambulatory Visit: Payer: 59 | Admitting: Family Medicine

## 2021-12-09 VITALS — BP 110/86 | HR 81 | Ht 63.0 in

## 2021-12-09 DIAGNOSIS — M9904 Segmental and somatic dysfunction of sacral region: Secondary | ICD-10-CM

## 2021-12-09 DIAGNOSIS — M5030 Other cervical disc degeneration, unspecified cervical region: Secondary | ICD-10-CM | POA: Diagnosis not present

## 2021-12-09 DIAGNOSIS — M9903 Segmental and somatic dysfunction of lumbar region: Secondary | ICD-10-CM

## 2021-12-09 DIAGNOSIS — M9908 Segmental and somatic dysfunction of rib cage: Secondary | ICD-10-CM

## 2021-12-09 DIAGNOSIS — M9902 Segmental and somatic dysfunction of thoracic region: Secondary | ICD-10-CM | POA: Diagnosis not present

## 2021-12-09 DIAGNOSIS — M9901 Segmental and somatic dysfunction of cervical region: Secondary | ICD-10-CM | POA: Diagnosis not present

## 2021-12-09 MED ORDER — GABAPENTIN 100 MG PO CAPS: 200.0000 mg | ORAL_CAPSULE | Freq: Every day | ORAL | 0 refills | Status: DC

## 2021-12-09 NOTE — Assessment & Plan Note (Addendum)
DDD Discussed HEP  Discussed which activities  Worsening symptoms, refill gabapentin and vitamin D.  Responds well to manipulation.  Follow-up again in 6 to 8 weeks

## 2021-12-09 NOTE — Patient Instructions (Signed)
See me again in 6-8 weeks ?

## 2022-01-15 NOTE — Progress Notes (Signed)
  [place] [address] [place] Phone: [phone] Subjective:   IVilma Meckel, am serving as a scribe for Dr. Hulan Saas.  I'm seeing this patient by the request  of:  Patient, No Pcp Per  CC: Back pain follow-up  [id number]  Effie Hodgens is a 56 y.o. female coming in with complaint of back and neck pain. OMT 12/09/2021.  Patient states doing well since last visit. Here for manipulation. No new issues.   Medications patient has been prescribed: Gabapentin, Vit D  Taking:         Reviewed prior external information including notes and imaging from previsou exam, outside providers and external EMR if available.   As well as notes that were available from care everywhere and other healthcare systems.  Past medical history, social, surgical and family history all reviewed in electronic medical record.  No pertanent information unless stated regarding to the chief complaint.   Past Medical History:  Diagnosis Date   Nonallopathic lesion of lumbosacral region 06/15/2019    Allergies  Allergen Reactions   Penicillins Hives     Review of Systems:  No  visual changes, nausea, vomiting, diarrhea, constipation, dizziness, abdominal pain, skin rash, fevers, chills, night sweats, weight loss, swollen lymph nodes, body aches, joint swelling, chest pain, shortness of breath, mood changes. POSITIVE muscle aches, headache  Objective  Blood pressure (!) 130/90, pulse 82, height '5\' 3"'$  (1.6 m), weight 142 lb (64.4 kg), SpO2 96 %.   General: No apparent distress alert and oriented x3 mood and affect normal, dressed appropriately.  HEENT: Pupils equal, extraocular movements intact  Respiratory: Patient's speak in full sentences and does not appear short of breath  Cardiovascular: No lower extremity edema, non tender, no erythema  Gait MSK:  Back   Osteopathic findings  C2 flexed rotated and side bent right C6 flexed rotated and  side bent left T3 extended rotated and side bent right inhaled rib T9 extended rotated and side bent left L2 flexed rotated and side bent right Sacrum right on right       Assessment and Plan:  Degenerative disc disease, cervical Degenerative disc disease of the cervical spine.  Discussed home exercises again.  Refilled gabapentin patient's prescription at the 200 mg.  Does have mild exacerbation.  Chronic problem.  Responding well to osteopathic manipulation but follow-up again in 6 to 8 weeks for further evaluation and management.    Nonallopathic problems  Decision today to treat with OMT was based on Physical Exam  After verbal consent patient was treated with HVLA, ME, FPR techniques in cervical, rib, thoracic, lumbar, and sacral  areas  Patient tolerated the procedure well with improvement in symptoms  Patient given exercises, stretches and lifestyle modifications  See medications in patient instructions if given  Patient will follow up in 4-8 weeks     The above documentation has been reviewed and is accurate and complete Lyndal Pulley, DO         Note: This dictation was prepared with Dragon dictation along with smaller phrase technology. Any transcriptional errors that result from this process are unintentional.

## 2022-01-19 ENCOUNTER — Encounter: Payer: Self-pay | Admitting: Family Medicine

## 2022-01-19 ENCOUNTER — Ambulatory Visit: Payer: 59 | Admitting: Family Medicine

## 2022-01-19 VITALS — BP 130/90 | HR 82 | Ht 63.0 in | Wt 142.0 lb

## 2022-01-19 DIAGNOSIS — M9901 Segmental and somatic dysfunction of cervical region: Secondary | ICD-10-CM | POA: Diagnosis not present

## 2022-01-19 DIAGNOSIS — M9902 Segmental and somatic dysfunction of thoracic region: Secondary | ICD-10-CM

## 2022-01-19 DIAGNOSIS — M9908 Segmental and somatic dysfunction of rib cage: Secondary | ICD-10-CM

## 2022-01-19 DIAGNOSIS — M5030 Other cervical disc degeneration, unspecified cervical region: Secondary | ICD-10-CM | POA: Diagnosis not present

## 2022-01-19 DIAGNOSIS — M9904 Segmental and somatic dysfunction of sacral region: Secondary | ICD-10-CM | POA: Diagnosis not present

## 2022-01-19 DIAGNOSIS — M9903 Segmental and somatic dysfunction of lumbar region: Secondary | ICD-10-CM

## 2022-01-19 MED ORDER — GABAPENTIN 100 MG PO CAPS: 200.0000 mg | ORAL_CAPSULE | Freq: Every day | ORAL | 0 refills | Status: DC

## 2022-01-19 NOTE — Patient Instructions (Addendum)
Good to see you! Refilled Gabapentin Invest in better shoes

## 2022-01-19 NOTE — Assessment & Plan Note (Signed)
Degenerative disc disease of the cervical spine.  Discussed home exercises again.  Refilled gabapentin patient's prescription at the 200 mg.  Does have mild exacerbation.  Chronic problem.  Responding well to osteopathic manipulation but follow-up again in 6 to 8 weeks for further evaluation and management.

## 2022-03-08 ENCOUNTER — Other Ambulatory Visit: Payer: Self-pay | Admitting: Family Medicine

## 2022-03-18 NOTE — Progress Notes (Signed)
[place] Phone: [phone] Subjective:   Nicole Nicole [name], am serving as a scribe for Dr. Hulan Saas.  I'm seeing this patient by the request  of:  Patient, Nicole [name] Pcp Per  CC: Back and neck pain follow-up  [id number]  Nicole Nicole [name] is a 56 y.o. female coming in with complaint of back and neck pain . OMT 01/19/2022. Patient states that on the R foot her 4th toe is numb for past 2 weeks.   Tingling in left foot over all meta heads for past 2 months. Symptoms are worse after walking. Is going to look at getting [place].   Also complains of pain in wrist extensor in middle of muscle belly.   Medications patient has been prescribed: Gabapentin, Vit D  Taking:         Reviewed prior external information including notes and imaging from previsou exam, outside providers and external EMR if available.   As well as notes that were available from care everywhere and other healthcare systems.  Past medical history, social, surgical and family history all reviewed in electronic medical record.  Nicole [name] pertanent information unless stated regarding to the chief complaint.   Past Medical History:  Diagnosis Date   Nonallopathic lesion of lumbosacral region 06/15/2019    Allergies  Allergen Reactions   Penicillins Hives     Review of Systems:  Nicole [name] headache, visual changes, nausea, vomiting, diarrhea, constipation, dizziness, abdominal pain, skin rash, fevers, chills, night sweats, weight loss, swollen lymph nodes, body aches, joint swelling, chest pain, shortness of breath, mood changes. POSITIVE muscle aches  Objective  Blood pressure 122/86, pulse 68, height '5\' 3"'$  (1.6 m), SpO2 99 %.   General: Nicole [name] apparent distress alert and oriented x3 mood and affect normal, dressed appropriately.  HEENT: Pupils equal, extraocular movements intact  Respiratory: Patient's speak in full sentences and does not appear short of breath   Cardiovascular: Nicole [name] lower extremity edema, non tender, Nicole [name] erythema  Low back does have some loss of lordosis and some tenderness to palpation noted.  Tightness noted around the neck more than anywhere else at the moment.  Patient does have some limited sidebending bilaterally.  Osteopathic findings  C2 flexed rotated and side bent right C6 flexed rotated and side bent left T3 extended rotated and side bent right inhaled rib T9 extended rotated and side bent left L2 flexed rotated and side bent right Sacrum right on right       Assessment and Plan:  Degenerative disc disease, cervical Known degenerative disc disease but does respond extremely well to osteopathic manipulation.  Discussed posture and ergonomics as well.  Discussed which activities to do and which ones to avoid.  Increase activity slowly.  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  Discussed icing regimen and home exercises, discussed which activities to do and which ones to avoid.  Increase activity slowly.  We discussed over-the-counter orthotics.  Proper shoes.  Worsening pain consider injection for possible neuromas.    Nonallopathic problems  Decision today to treat with OMT was based on Physical Exam  After verbal consent patient was treated with HVLA, ME, FPR techniques in cervical, rib, thoracic, lumbar, and sacral  areas  Patient tolerated the procedure well with improvement in symptoms  Patient given exercises, stretches and lifestyle modifications  See medications in patient instructions if given  Patient will follow up  in 4-8 weeks    The above documentation has been reviewed and is accurate and complete Lyndal Pulley, DO          Note: This dictation was prepared with Dragon dictation along with smaller phrase technology. Any transcriptional errors that result from this process are unintentional.

## 2022-03-24 ENCOUNTER — Ambulatory Visit: Payer: 59 | Admitting: Family Medicine

## 2022-03-24 ENCOUNTER — Encounter: Payer: Self-pay | Admitting: Family Medicine

## 2022-03-24 VITALS — BP 122/86 | HR 68 | Ht 63.0 in

## 2022-03-24 DIAGNOSIS — M5030 Other cervical disc degeneration, unspecified cervical region: Secondary | ICD-10-CM

## 2022-03-24 DIAGNOSIS — M9904 Segmental and somatic dysfunction of sacral region: Secondary | ICD-10-CM

## 2022-03-24 DIAGNOSIS — M216X9 Other acquired deformities of unspecified foot: Secondary | ICD-10-CM

## 2022-03-24 DIAGNOSIS — M9901 Segmental and somatic dysfunction of cervical region: Secondary | ICD-10-CM

## 2022-03-24 DIAGNOSIS — M9908 Segmental and somatic dysfunction of rib cage: Secondary | ICD-10-CM | POA: Diagnosis not present

## 2022-03-24 DIAGNOSIS — M9902 Segmental and somatic dysfunction of thoracic region: Secondary | ICD-10-CM | POA: Diagnosis not present

## 2022-03-24 DIAGNOSIS — M9903 Segmental and somatic dysfunction of lumbar region: Secondary | ICD-10-CM | POA: Diagnosis not present

## 2022-03-24 MED ORDER — GABAPENTIN 100 MG PO CAPS: 200.0000 mg | ORAL_CAPSULE | Freq: Every day | ORAL | 0 refills | Status: DC

## 2022-03-24 NOTE — Assessment & Plan Note (Addendum)
Known degenerative disc disease but does respond extremely well to osteopathic manipulation.  Discussed posture and ergonomics as well.  Discussed which activities to do and which ones to avoid.  Increase activity slowly.  Follow-up again in 6 to 8 weeks refilled gabapentin 100 mg 200 mg at night

## 2022-03-24 NOTE — Patient Instructions (Addendum)
Don't lace the last eye of shoe [employer] Total Support Original Keep being active See me in 2 months

## 2022-03-24 NOTE — Assessment & Plan Note (Signed)
Breakdown of the transverse arch bilaterally.  Discussed icing regimen and home exercises, discussed which activities to do and which ones to avoid.  Increase activity slowly.  We discussed over-the-counter orthotics.  Proper shoes.  Worsening pain consider injection for possible neuromas.

## 2022-05-12 ENCOUNTER — Encounter: Payer: Self-pay | Admitting: Family Medicine

## 2022-05-12 ENCOUNTER — Ambulatory Visit: Payer: 59 | Admitting: Family Medicine

## 2022-05-12 VITALS — BP 132/86 | HR 84 | Temp 97.8°F | Ht 63.0 in | Wt 128.0 lb

## 2022-05-12 DIAGNOSIS — K5900 Constipation, unspecified: Secondary | ICD-10-CM | POA: Insufficient documentation

## 2022-05-12 DIAGNOSIS — Z818 Family history of other mental and behavioral disorders: Secondary | ICD-10-CM

## 2022-05-12 DIAGNOSIS — R030 Elevated blood-pressure reading, without diagnosis of hypertension: Secondary | ICD-10-CM | POA: Diagnosis not present

## 2022-05-12 DIAGNOSIS — E785 Hyperlipidemia, unspecified: Secondary | ICD-10-CM

## 2022-05-12 DIAGNOSIS — R413 Other amnesia: Secondary | ICD-10-CM | POA: Diagnosis not present

## 2022-05-12 DIAGNOSIS — K219 Gastro-esophageal reflux disease without esophagitis: Secondary | ICD-10-CM | POA: Insufficient documentation

## 2022-05-12 DIAGNOSIS — Z87891 Personal history of nicotine dependence: Secondary | ICD-10-CM

## 2022-05-12 LAB — CBC WITH DIFFERENTIAL/PLATELET
Basophils Absolute: 0 10*3/uL (ref 0.0–0.1)
Basophils Relative: 0.9 % (ref 0.0–3.0)
Eosinophils Absolute: 0.1 10*3/uL (ref 0.0–0.7)
Eosinophils Relative: 2.2 % (ref 0.0–5.0)
HCT: 45.4 % (ref 36.0–46.0)
Hemoglobin: 15.6 g/dL — ABNORMAL HIGH (ref 12.0–15.0)
Lymphocytes Relative: 38.8 % (ref 12.0–46.0)
Lymphs Abs: 2 10*3/uL (ref 0.7–4.0)
MCHC: 34.3 g/dL (ref 30.0–36.0)
MCV: 94.3 fl (ref 78.0–100.0)
Monocytes Absolute: 0.4 10*3/uL (ref 0.1–1.0)
Monocytes Relative: 7.1 % (ref 3.0–12.0)
Neutro Abs: 2.7 10*3/uL (ref 1.4–7.7)
Neutrophils Relative %: 51 % (ref 43.0–77.0)
Platelets: 237 10*3/uL (ref 150.0–400.0)
RBC: 4.82 Mil/uL (ref 3.87–5.11)
RDW: 13 % (ref 11.5–15.5)
WBC: 5.2 10*3/uL (ref 4.0–10.5)

## 2022-05-12 LAB — LIPID PANEL
Cholesterol: 228 mg/dL — ABNORMAL HIGH (ref 0–200)
HDL: 67 mg/dL (ref >39.00)
LDL Cholesterol: 141 mg/dL — ABNORMAL HIGH (ref 0–99)
NonHDL: 160.66
Total CHOL/HDL Ratio: 3
Triglycerides: 97 mg/dL (ref 0.0–149.0)
VLDL: 19.4 mg/dL (ref 0.0–40.0)

## 2022-05-12 LAB — COMPREHENSIVE METABOLIC PANEL
ALT: 12 U/L (ref 0–35)
AST: 18 U/L (ref 0–37)
Albumin: 4.7 g/dL (ref 3.5–5.2)
Alkaline Phosphatase: 69 U/L (ref 39–117)
BUN: 12 mg/dL (ref 6–23)
CO2: 29 mEq/L (ref 19–32)
Calcium: 9.9 mg/dL (ref 8.4–10.5)
Chloride: 102 mEq/L (ref 96–112)
Creatinine, Ser: 0.85 mg/dL (ref 0.40–1.20)
GFR: 75.52 mL/min (ref >60.00)
Glucose, Bld: 99 mg/dL (ref 70–99)
Potassium: 5 mEq/L (ref 3.5–5.1)
Sodium: 139 mEq/L (ref 135–145)
Total Bilirubin: 0.4 mg/dL (ref 0.2–1.2)
Total Protein: 7.4 g/dL (ref 6.0–8.3)

## 2022-05-12 LAB — TSH: TSH: 1.72 u[IU]/mL (ref 0.35–5.50)

## 2022-05-12 LAB — VITAMIN B12: Vitamin B-12: 203 pg/mL — ABNORMAL LOW (ref 211–911)

## 2022-05-12 LAB — T4, FREE: Free T4: 0.82 ng/dL (ref 0.60–1.60)

## 2022-05-12 NOTE — Assessment & Plan Note (Signed)
Monitor BP at home. Discussed low sodium, healthy diet and exercise. DASH diet handout provided.

## 2022-05-12 NOTE — Patient Instructions (Signed)
Thank you for trusting [place] with your health care.  Please go downstairs for labs before you leave.  You will hear from [facility] neurology to schedule an appointment.  This may take a few weeks.  Get a blood pressure monitor that fits your upper arm and start checking your blood pressure at home every day or 2 and bring in these readings to your follow-up visit.  DASH Eating Plan DASH stands for Dietary Approaches to Stop Hypertension. The DASH eating plan is a healthy eating plan that has been shown to: Reduce high blood pressure (hypertension). Reduce your risk for type 2 diabetes, heart disease, and stroke. Help with weight loss. What are tips for following this plan? Reading food labels Check food labels for the amount of salt (sodium) per serving. Choose foods with less than 5 percent of the Daily Value of sodium. Generally, foods with less than 300 milligrams (mg) of sodium per serving fit into this eating plan. To find whole grains, look for the word "whole" as the first word in the ingredient list. Shopping Buy products labeled as "low-sodium" or "no salt added." Buy fresh foods. Avoid canned foods and pre-made or frozen meals. Cooking Avoid adding salt when cooking. Use salt-free seasonings or herbs instead of table salt or sea salt. Check with your health care provider or pharmacist before using salt substitutes. Do not fry foods. Cook foods using healthy methods such as baking, boiling, grilling, roasting, and broiling instead. Cook with heart-healthy oils, such as olive, canola, avocado, soybean, or sunflower oil. Meal planning  Eat a balanced diet that includes: 4 or more servings of fruits and 4 or more servings of vegetables each day. Try to fill one-half of your plate with fruits and vegetables. 6-8 servings of whole grains each day. Less than 6 oz (170 g) of lean meat, poultry, or fish each day. A 3-oz (85-g) serving of meat is about the same size as a deck of cards. One  egg equals 1 oz (28 g). 2-3 servings of low-fat dairy each day. One serving is 1 cup (237 mL). 1 serving of nuts, seeds, or beans 5 times each week. 2-3 servings of heart-healthy fats. Healthy fats called omega-3 fatty acids are found in foods such as walnuts, flaxseeds, fortified milks, and eggs. These fats are also found in cold-water fish, such as sardines, salmon, and mackerel. Limit how much you eat of: Canned or prepackaged foods. Food that is high in trans fat, such as some fried foods. Food that is high in saturated fat, such as fatty meat. Desserts and other sweets, sugary drinks, and other foods with added sugar. Full-fat dairy products. Do not salt foods before eating. Do not eat more than 4 egg yolks a week. Try to eat at least 2 vegetarian meals a week. Eat more home-cooked food and less restaurant, buffet, and fast food. Lifestyle When eating at a restaurant, ask that your food be prepared with less salt or no salt, if possible. If you drink alcohol: Limit how much you use to: 0-1 drink a day for women who are not pregnant. 0-2 drinks a day for men. Be aware of how much alcohol is in your drink. In the [place] one drink equals one 12 oz bottle of beer (355 mL), one 5 oz glass of wine (148 mL), or one 1 oz glass of hard liquor (44 mL). General information Avoid eating more than 2,300 mg of salt a day. If you have hypertension, you may need to reduce  your sodium intake to 1,500 mg a day. Work with your health care provider to maintain a healthy body weight or to lose weight. Ask what an ideal weight is for you. Get at least 30 minutes of exercise that causes your heart to beat faster (aerobic exercise) most days of the week. Activities may include walking, swimming, or biking. Work with your health care provider or dietitian to adjust your eating plan to your individual calorie needs. What foods should I eat? Fruits All fresh, dried, or frozen fruit. Canned fruit in natural  juice (without added sugar). Vegetables Fresh or frozen vegetables (raw, steamed, roasted, or grilled). Low-sodium or reduced-sodium tomato and vegetable juice. Low-sodium or reduced-sodium tomato sauce and tomato paste. Low-sodium or reduced-sodium canned vegetables. Grains Whole-grain or whole-wheat bread. Whole-grain or whole-wheat pasta. Brown rice. [place]. Bulgur. Whole-grain and low-sodium cereals. Pita bread. Low-fat, low-sodium crackers. Whole-wheat flour tortillas. Meats and other proteins Skinless chicken or [place]. Ground chicken or [place]. Pork with fat trimmed off. Fish and seafood. Egg whites. Dried beans, peas, or lentils. Unsalted nuts, nut butters, and seeds. Unsalted canned beans. Lean cuts of beef with fat trimmed off. Low-sodium, lean precooked or cured meat, such as sausages or meat loaves. Dairy Low-fat (1%) or fat-free (skim) milk. Reduced-fat, low-fat, or fat-free cheeses. Nonfat, low-sodium ricotta or cottage cheese. Low-fat or nonfat yogurt. Low-fat, low-sodium cheese. Fats and oils Soft margarine without trans fats. Vegetable oil. Reduced-fat, low-fat, or light mayonnaise and salad dressings (reduced-sodium). Canola, safflower, olive, avocado, soybean, and sunflower oils. Avocado. Seasonings and condiments Herbs. Spices. Seasoning mixes without salt. Other foods Unsalted popcorn and pretzels. Fat-free sweets. The items listed above may not be a complete list of foods and beverages you can eat. Contact a dietitian for more information. What foods should I avoid? Fruits Canned fruit in a light or heavy syrup. Fried fruit. Fruit in cream or butter sauce. Vegetables Creamed or fried vegetables. Vegetables in a cheese sauce. Regular canned vegetables (not low-sodium or reduced-sodium). Regular canned tomato sauce and paste (not low-sodium or reduced-sodium). Regular tomato and vegetable juice (not low-sodium or reduced-sodium). Angie Fava. Olives. Grains Baked goods  made with fat, such as croissants, muffins, or some breads. Dry pasta or rice meal packs. Meats and other proteins Fatty cuts of meat. Ribs. Fried meat. Berniece Salines. Bologna, salami, and other precooked or cured meats, such as sausages or meat loaves. Fat from the back of a pig (fatback). Bratwurst. Salted nuts and seeds. Canned beans with added salt. Canned or smoked fish. Whole eggs or egg yolks. Chicken or [place] with skin. Dairy Whole or 2% milk, cream, and half-and-half. Whole or full-fat cream cheese. Whole-fat or sweetened yogurt. Full-fat cheese. Nondairy creamers. Whipped toppings. Processed cheese and cheese spreads. Fats and oils Butter. Stick margarine. Lard. Shortening. Ghee. Bacon fat. Tropical oils, such as coconut, palm kernel, or palm oil. Seasonings and condiments Onion salt, garlic salt, seasoned salt, table salt, and sea salt. Worcestershire sauce. Tartar sauce. Barbecue sauce. Teriyaki sauce. Soy sauce, including reduced-sodium. Steak sauce. Canned and packaged gravies. Fish sauce. Oyster sauce. Cocktail sauce. Store-bought horseradish. Ketchup. Mustard. Meat flavorings and tenderizers. Bouillon cubes. Hot sauces. Pre-made or packaged marinades. Pre-made or packaged taco seasonings. Relishes. Regular salad dressings. Other foods Salted popcorn and pretzels. The items listed above may not be a complete list of foods and beverages you should avoid. Contact a dietitian for more information. Where to find more information National Heart, Lung, and Blood Institute: https://wilson-eaton.com/ American Heart Association: www.heart.org Academy  of Nutrition and Dietetics: www.eatright.[place]: www.kidney.org Summary The DASH eating plan is a healthy eating plan that has been shown to reduce high blood pressure (hypertension). It may also reduce your risk for type 2 diabetes, heart disease, and stroke. When on the DASH eating plan, aim to eat more fresh fruits and vegetables,  whole grains, lean proteins, low-fat dairy, and heart-healthy fats. With the DASH eating plan, you should limit salt (sodium) intake to 2,300 mg a day. If you have hypertension, you may need to reduce your sodium intake to 1,500 mg a day. Work with your health care provider or dietitian to adjust your eating plan to your individual calorie needs. This information is not intended to replace advice given to you by your health care provider. Make sure you discuss any questions you have with your health care provider. Document Revised: 03/03/2019 Document Reviewed: 03/03/2019 Elsevier Patient Education  [place].

## 2022-05-12 NOTE — Progress Notes (Signed)
New Patient Office Visit  Subjective    Patient ID: Nicole [name], female    DOB: 07-21-1965  Age: 57 y.o. MRN: [id number]  CC:  Chief Complaint  Patient presents with   Establish Care    Needs to schedule physical, last time had blood work cholesterol was high. Also wants to check on BP and memory, father passed away with dementia.     HPI Nicole [name] presents to establish care Previous PCP - [facility]   [facility]N- [facility]  Sports medicine- Dr. Tamala Julian   Former smoker. Stopped many years ago.   On HRTs prescribed by gynecologist.   No hx of HTN. BP has been elevated recently.   HL in past. Is not on a statin.   She has lost 13 lbs since August 2023.  Her mother passed around that time.   Father had dementia. Diagnosed at age 40.  Mother was on Aricept at age 20.   She is concerned about memory issues. Is not getting lost.  Forgetting things but nothing major.  She has a 52 year old daughter. Family has not noticed her being forgetful.   Alcohol use is occasional.  No drug use.   Is not taking any supplements.   No labs available today.   Denies fever, chills, fatigue, night sweats, dizziness, chest pain, palpitations, shortness of breath, abdominal pain, N/V/D, urinary symptoms, LE edema.      Outpatient Encounter Medications as of 05/12/2022  Medication Sig   estradiol (ESTRACE) 1 MG tablet Take 1 mg by mouth daily.   gabapentin (NEURONTIN) 100 MG capsule Take 2 capsules (200 mg total) by mouth at bedtime.   progesterone (PROMETRIUM) 100 MG capsule Take 100 mg by mouth at bedtime.   Vitamin D, Ergocalciferol, (DRISDOL) 1.25 MG (50000 UNIT) CAPS capsule TAKE 1 CAPSULE (50,000 UNITS TOTAL) BY MOUTH EVERY 7 (SEVEN) DAYS   [DISCONTINUED] progesterone (PROMETRIUM) 100 MG capsule Take 100 mg by mouth at bedtime.   [DISCONTINUED] Vitamin D, Ergocalciferol, (DRISDOL) 1.25 MG (50000 UNIT) CAPS capsule Take 1 capsule (50,000 Units total) by mouth every 7 (seven) days.  (Patient not taking: Reported on 05/12/2022)   [DISCONTINUED] VIVELLE-DOT 0.1 MG/24HR patch 1 patch 2 (two) times a week.   No facility-administered encounter medications on file as of 05/12/2022.    Past Medical History:  Diagnosis Date   Nonallopathic lesion of lumbosacral region 06/15/2019    Past Surgical History:  Procedure Laterality Date   ablasion     BREAST SURGERY  25years ago   COLONOSCOPY  ? 2012   Dr.Mann-polyps-recall 5 years per pt   TUBAL LIGATION      Family History  Problem Relation Age of Onset   Hypertension Mother    Hypothyroidism Mother    Heart disease Father    Alzheimer's disease Father    Congestive Heart Failure Father    Colon polyps Neg Hx    Colon cancer Neg Hx    Esophageal cancer Neg Hx    Stomach cancer Neg Hx    Rectal cancer Neg Hx     Social History   Socioeconomic History   Marital status: Married    Spouse name: Not on file   Number of children: Not on file   Years of education: Not on file   Highest education level: Not on file  Occupational History   Not on file  Tobacco Use   Smoking status: Former   Smokeless tobacco: Never  Vaping Use   Vaping  Use: Never used  Substance and Sexual Activity   Alcohol use: No   Drug use: No   Sexual activity: Not on file  Other Topics Concern   Not on file  Social History Narrative   Not on file   Social Determinants of Health   Financial Resource Strain: Not on file  Food Insecurity: Not on file  Transportation Needs: Not on file  Physical Activity: Not on file  Stress: Not on file  Social Connections: Not on file  Intimate Partner Violence: Not on file    ROS      Objective    BP 132/86 (BP Location: Left Arm, Patient Position: Sitting, Cuff Size: Large)   Pulse 84   Temp 97.8 F (36.6 C) (Temporal)   Ht '5\' 3"'$  (1.6 m)   Wt 128 lb (58.1 kg)   SpO2 99%   BMI 22.67 kg/m   Physical Exam Constitutional:      General: She is not in acute distress.    Appearance:  She is not ill-appearing.  HENT:     Mouth/Throat:     Mouth: Mucous membranes are moist.  Eyes:     Extraocular Movements: Extraocular movements intact.     Conjunctiva/sclera: Conjunctivae normal.     Pupils: Pupils are equal, round, and reactive to light.  Cardiovascular:     Rate and Rhythm: Normal rate and regular rhythm.     Heart sounds: Normal heart sounds.  Pulmonary:     Effort: Pulmonary effort is normal.     Breath sounds: Normal breath sounds.  Musculoskeletal:     Cervical back: Normal range of motion and neck supple.  Skin:    General: Skin is warm and dry.  Neurological:     General: No focal deficit present.     Mental Status: She is alert and oriented to person, place, and time.  Psychiatric:        Mood and Affect: Mood normal.        Behavior: Behavior normal.        Thought Content: Thought content normal.         Assessment & Plan:   Problem List Items Addressed This Visit       Other   Elevated blood pressure reading in office without diagnosis of hypertension    Monitor BP at home. Discussed low sodium, healthy diet and exercise. DASH diet handout provided.       Relevant Orders   CBC with Differential/Platelet   Comprehensive metabolic panel   Family history of first degree relative with dementia   Relevant Orders   Ambulatory referral to Neurology   Former smoker    Stopped smoking many years ago. Asymptomatic.       Hyperlipidemia - Primary    She is fasting. Check lipid panel. Follow up.       Relevant Orders   Lipid panel   Memory changes       05/12/2022    8:30 AM  6CIT Screen  What Year? 0 points  What month? 0 points  What time? 0 points  Count back from 20 0 points  Months in reverse 0 points  Repeat phrase 0 points  Total Score 0 points   Check labs to look for reversible causes. Discussed physical exercise and brain exercises with crosswords, brain games, learning a new skill or hobby. Limit alcohol.  Will refer  to neurologist due to family hx of father with what sounds like early onset dementia  and mother also with dementia and passing away at age 62      Relevant Orders   CBC with Differential/Platelet   Comprehensive metabolic panel   TSH   T4, free   Vitamin B12   RPR   HIV Antibody (routine testing w rflx)   Ambulatory referral to Neurology       Return for f/u CPE in 4-6 wks.   Harland Dingwall, NP-C

## 2022-05-12 NOTE — Assessment & Plan Note (Signed)
Stopped smoking many years ago. Asymptomatic.

## 2022-05-12 NOTE — Assessment & Plan Note (Signed)
05/12/2022    8:30 AM  6CIT Screen  What Year? 0 points  What month? 0 points  What time? 0 points  Count back from 20 0 points  Months in reverse 0 points  Repeat phrase 0 points  Total Score 0 points    Check labs to look for reversible causes. Discussed physical exercise and brain exercises with crosswords, brain games, learning a new skill or hobby. Limit alcohol.  Will refer to neurologist due to family hx of father with what sounds like early onset dementia and mother also with dementia and passing away at age 84

## 2022-05-12 NOTE — Assessment & Plan Note (Signed)
She is fasting. Check lipid panel. Follow up.

## 2022-05-13 LAB — RPR: RPR Ser Ql: NONREACTIVE

## 2022-05-13 LAB — HIV ANTIBODY (ROUTINE TESTING W REFLEX): HIV 1&2 Ab, 4th Generation: NONREACTIVE

## 2022-05-13 NOTE — Progress Notes (Signed)
Her B12 is low. She can come in for B12 injections weekly for 4 weeks if she would like to help get this is a better range or start on an over the counter vitamin B12 supplement 1,000 mcg daily. Her LDL, the bad cholesterol, is elevated. This can be improved with eating a low fat, low cholesterol diet and being active. She does not need to start on cholesterol medication at this point. Her labs are fine otherwise.

## 2022-05-21 NOTE — Progress Notes (Signed)
  Nicole Nicole [name] [place] Phone: [phone] Subjective:   Nicole Nicole [name], am serving as a scribe for Dr. Hulan Saas.  I'm seeing this patient by the request  of:  Nicole [name], Nicole L, NP-C  CC: Back and neck pain follow-up  Nicole [name]:Nicole Nicole [name]  Nicole Nicole [name] is a 57 y.o. female coming in with complaint of back and neck pain. OMT 03/24/2022. Patient states mild tightness but nothing severe.  Has been doing the exercises occasionally.  Has been able to continue to be active.  Medications patient has been prescribed: Gabapentin, Vit D  Taking:         Reviewed prior external information including notes and imaging from previsou exam, outside providers and external EMR if available.   As well as notes that were available from care everywhere and other healthcare systems.  Past medical history, social, surgical and family history all reviewed in electronic medical record.  Nicole [name] pertanent information unless stated regarding to the chief complaint.   Past Medical History:  Diagnosis Date   Nonallopathic lesion of lumbosacral region 06/15/2019    Allergies  Allergen Reactions   Penicillins Hives     Review of Systems:  Nicole [name] headache, visual changes, nausea, vomiting, diarrhea, constipation, dizziness, abdominal pain, skin rash, fevers, chills, night sweats, weight loss, swollen lymph nodes, body aches, joint swelling, chest pain, shortness of breath, mood changes. POSITIVE muscle aches  Objective  Blood pressure (!) 138/92, pulse 80, height '5\' 3"'$  (1.6 m), weight 131 lb (59.4 kg), SpO2 98 %.   General: Nicole [name] apparent distress alert and oriented x3 mood and affect normal, dressed appropriately.  HEENT: Pupils equal, extraocular movements intact  Respiratory: Patient's speak in full sentences and does not appear short of breath  Cardiovascular: Nicole [name] lower extremity edema, non tender, Nicole [name] erythema  Gait MSK:  Back   Osteopathic  findings  C2 flexed rotated and side bent right T9 extended rotated and side bent left L2 flexed rotated and side bent right Sacrum right on right       Assessment and Plan:  Degenerative disc disease, cervical Doing well. Nicole [name] changes  Follow-up again in 8 weeks for further evaluation     Nonallopathic problems  Decision today to treat with OMT was based on Physical Exam  After verbal consent patient was treated with HVLA, ME, FPR techniques in cervical, , thoracic, lumbar, and sacral  areas  Patient tolerated the procedure well with improvement in symptoms  Patient given exercises, stretches and lifestyle modifications  See medications in patient instructions if given  Patient will follow up in 4-8 weeks    The above documentation has been reviewed and is accurate and complete Nicole Pulley, DO         Note: This dictation was prepared with Dragon dictation along with smaller phrase technology. Any transcriptional errors that result from this process are unintentional.

## 2022-05-25 ENCOUNTER — Encounter: Payer: Self-pay | Admitting: Family Medicine

## 2022-05-25 ENCOUNTER — Ambulatory Visit: Payer: 59 | Admitting: Family Medicine

## 2022-05-25 VITALS — BP 138/92 | HR 80 | Ht 63.0 in | Wt 131.0 lb

## 2022-05-25 DIAGNOSIS — M9902 Segmental and somatic dysfunction of thoracic region: Secondary | ICD-10-CM | POA: Diagnosis not present

## 2022-05-25 DIAGNOSIS — M9903 Segmental and somatic dysfunction of lumbar region: Secondary | ICD-10-CM

## 2022-05-25 DIAGNOSIS — M5030 Other cervical disc degeneration, unspecified cervical region: Secondary | ICD-10-CM

## 2022-05-25 DIAGNOSIS — M9901 Segmental and somatic dysfunction of cervical region: Secondary | ICD-10-CM | POA: Diagnosis not present

## 2022-05-25 DIAGNOSIS — M9904 Segmental and somatic dysfunction of sacral region: Secondary | ICD-10-CM

## 2022-05-25 NOTE — Assessment & Plan Note (Signed)
Doing well. No changes  Follow-up again in 8 weeks for further evaluation

## 2022-05-25 NOTE — Patient Instructions (Signed)
Great to see you Keep doing what you are doing See me in 2 months

## 2022-05-29 ENCOUNTER — Other Ambulatory Visit: Payer: Self-pay | Admitting: Family Medicine

## 2022-06-02 LAB — HM MAMMOGRAPHY

## 2022-06-04 ENCOUNTER — Encounter: Payer: Self-pay | Admitting: Family Medicine

## 2022-06-17 ENCOUNTER — Encounter: Payer: Self-pay | Admitting: Family Medicine

## 2022-06-23 ENCOUNTER — Encounter: Payer: Self-pay | Admitting: Family Medicine

## 2022-06-23 ENCOUNTER — Ambulatory Visit (INDEPENDENT_AMBULATORY_CARE_PROVIDER_SITE_OTHER): Payer: 59 | Admitting: Family Medicine

## 2022-06-23 VITALS — BP 134/86 | HR 77 | Temp 97.6°F | Ht 63.0 in | Wt 128.0 lb

## 2022-06-23 DIAGNOSIS — K921 Melena: Secondary | ICD-10-CM

## 2022-06-23 DIAGNOSIS — E785 Hyperlipidemia, unspecified: Secondary | ICD-10-CM

## 2022-06-23 DIAGNOSIS — Z23 Encounter for immunization: Secondary | ICD-10-CM | POA: Diagnosis not present

## 2022-06-23 DIAGNOSIS — Z1159 Encounter for screening for other viral diseases: Secondary | ICD-10-CM

## 2022-06-23 DIAGNOSIS — Z79899 Other long term (current) drug therapy: Secondary | ICD-10-CM

## 2022-06-23 DIAGNOSIS — M8580 Other specified disorders of bone density and structure, unspecified site: Secondary | ICD-10-CM

## 2022-06-23 DIAGNOSIS — Z0001 Encounter for general adult medical examination with abnormal findings: Secondary | ICD-10-CM

## 2022-06-23 DIAGNOSIS — E538 Deficiency of other specified B group vitamins: Secondary | ICD-10-CM

## 2022-06-23 DIAGNOSIS — R233 Spontaneous ecchymoses: Secondary | ICD-10-CM

## 2022-06-23 LAB — CBC WITH DIFFERENTIAL/PLATELET
Basophils Absolute: 0 10*3/uL (ref 0.0–0.1)
Basophils Relative: 0.6 % (ref 0.0–3.0)
Eosinophils Absolute: 0.1 10*3/uL (ref 0.0–0.7)
Eosinophils Relative: 2.2 % (ref 0.0–5.0)
HCT: 45.2 % (ref 36.0–46.0)
Hemoglobin: 15.2 g/dL — ABNORMAL HIGH (ref 12.0–15.0)
Lymphocytes Relative: 36.1 % (ref 12.0–46.0)
Lymphs Abs: 2.1 10*3/uL (ref 0.7–4.0)
MCHC: 33.7 g/dL (ref 30.0–36.0)
MCV: 95.3 fl (ref 78.0–100.0)
Monocytes Absolute: 0.4 10*3/uL (ref 0.1–1.0)
Monocytes Relative: 6.4 % (ref 3.0–12.0)
Neutro Abs: 3.2 10*3/uL (ref 1.4–7.7)
Neutrophils Relative %: 54.7 % (ref 43.0–77.0)
Platelets: 243 10*3/uL (ref 150.0–400.0)
RBC: 4.74 Mil/uL (ref 3.87–5.11)
RDW: 13.1 % (ref 11.5–15.5)
WBC: 5.8 10*3/uL (ref 4.0–10.5)

## 2022-06-23 NOTE — Patient Instructions (Addendum)
Please go downstairs for labs before you leave.  Continue to monitor your blood pressure at home.  Let me know if your readings are consistently higher than 130/80  Call and schedule with Dr. Hilarie Fredrickson for blood in your stool.   See your neurologist as scheduled next week.  Call and schedule with your dermatologist  We will be in touch with your lab results.  Preventive Care 41-59 Years Old, Female Preventive care refers to lifestyle choices and visits with your health care provider that can promote health and wellness. Preventive care visits are also called wellness exams. What can I expect for my preventive care visit? Counseling Your health care provider may ask you questions about your: Medical history, including: Past medical problems. Family medical history. Pregnancy history. Current health, including: Menstrual cycle. Method of birth control. Emotional well-being. Home life and relationship well-being. Sexual activity and sexual health. Lifestyle, including: Alcohol, nicotine or tobacco, and drug use. Access to firearms. Diet, exercise, and sleep habits. Work and work Statistician. Sunscreen use. Safety issues such as seatbelt and bike helmet use. Physical exam Your health care provider will check your: Height and weight. These may be used to calculate your BMI (body mass index). BMI is a measurement that tells if you are at a healthy weight. Waist circumference. This measures the distance around your waistline. This measurement also tells if you are at a healthy weight and may help predict your risk of certain diseases, such as type 2 diabetes and high blood pressure. Heart rate and blood pressure. Body temperature. Skin for abnormal spots. What immunizations do I need?  Vaccines are usually given at various ages, according to a schedule. Your health care provider will recommend vaccines for you based on your age, medical history, and lifestyle or other factors, such  as travel or where you work. What tests do I need? Screening Your health care provider may recommend screening tests for certain conditions. This may include: Lipid and cholesterol levels. Diabetes screening. This is done by checking your blood sugar (glucose) after you have not eaten for a while (fasting). Pelvic exam and Pap test. Hepatitis B test. Hepatitis C test. HIV (human immunodeficiency virus) test. STI (sexually transmitted infection) testing, if you are at risk. Lung cancer screening. Colorectal cancer screening. Mammogram. Talk with your health care provider about when you should start having regular mammograms. This may depend on whether you have a family history of breast cancer. BRCA-related cancer screening. This may be done if you have a family history of breast, ovarian, tubal, or peritoneal cancers. Bone density scan. This is done to screen for osteoporosis. Talk with your health care provider about your test results, treatment options, and if necessary, the need for more tests. Follow these instructions at home: Eating and drinking  Eat a diet that includes fresh fruits and vegetables, whole grains, lean protein, and low-fat dairy products. Take vitamin and mineral supplements as recommended by your health care provider. Do not drink alcohol if: Your health care provider tells you not to drink. You are pregnant, may be pregnant, or are planning to become pregnant. If you drink alcohol: Limit how much you have to 0-1 drink a day. Know how much alcohol is in your drink. In the [place] one drink equals one 12 oz bottle of beer (355 mL), one 5 oz glass of wine (148 mL), or one 1 oz glass of hard liquor (44 mL). Lifestyle Brush your teeth every morning and night with fluoride toothpaste. Floss one  time each day. Exercise for at least 30 minutes 5 or more days each week. Do not use any products that contain nicotine or tobacco. These products include cigarettes, chewing  tobacco, and vaping devices, such as e-cigarettes. If you need help quitting, ask your health care provider. Do not use drugs. If you are sexually active, practice safe sex. Use a condom or other form of protection to prevent STIs. If you do not wish to become pregnant, use a form of birth control. If you plan to become pregnant, see your health care provider for a prepregnancy visit. Take aspirin only as told by your health care provider. Make sure that you understand how much to take and what form to take. Work with your health care provider to find out whether it is safe and beneficial for you to take aspirin daily. Find healthy ways to manage stress, such as: Meditation, yoga, or listening to music. Journaling. Talking to a trusted person. Spending time with friends and family. Minimize exposure to UV radiation to reduce your risk of skin cancer. Safety Always wear your seat belt while driving or riding in a vehicle. Do not drive: If you have been drinking alcohol. Do not ride with someone who has been drinking. When you are tired or distracted. While texting. If you have been using any mind-altering substances or drugs. Wear a helmet and other protective equipment during sports activities. If you have firearms in your house, make sure you follow all gun safety procedures. Seek help if you have been physically or sexually abused. What's next? Visit your health care provider once a year for an annual wellness visit. Ask your health care provider how often you should have your eyes and teeth checked. Stay up to date on all vaccines. This information is not intended to replace advice given to you by your health care provider. Make sure you discuss any questions you have with your health care provider. Document Revised: 09/25/2020 Document Reviewed: 09/25/2020 Elsevier Patient Education  [employer].

## 2022-06-23 NOTE — Progress Notes (Signed)
Subjective:    Patient ID: Nicole [name], female    DOB: 06-28-1965, 57 y.o.   MRN: [id number]  HPI Chief Complaint  Patient presents with   Annual Exam   She is here for a complete physical exam. Previous PCP - [facility]   Other providers: OB/GYN- Erling Conte  Sports medicine- Dr. Tamala Julian  GI- Dr. Hilarie Fredrickson  Dermatologist   3 wk hx of blood in stool since starting metamucil. She plans to call and schedule with GI. Denies abdominal pain, N/V/D  She has lost 13 lbs since August 2023.  Her mother passed around that time.    Father had dementia. Diagnosed at age 8.  Mother was on Aricept at age 78. passed away during Covid with heart issues.    She is concerned about memory issues. Is not getting lost.  Forgetting things but nothing major.  She has a 54 year old daughter. Family has not noticed her being forgetful.   Referred to neurologist- appt next week    Alcohol use is occasional.  No drug use.  Former smoker, 20 pack year hx. Stopped 16 years ago.     Social history: married, 1 child, no grandchildren, works as Paediatric nurse- stand up desk   Diet: fairly healthy, low sodium  Excerise: regularly    Immunizations: Tdap overdue   Health maintenance:   Mammogram: last month  Colonoscopy: colonoscopy 07/2019 Last Gynecological Exam: last month  Last Dental Exam: every 6 months  Last Eye Exam: in past 6 months   Wears seatbelt always, uses sunscreen, smoke detectors in home and functioning, does not text while driving and feels safe in home environment.   Reviewed allergies, medications, past medical, surgical, family, and social history.    Review of Systems Review of Systems Constitutional: -fever, -chills, -sweats, -unexpected weight change,-fatigue ENT: -runny nose, -ear pain, -sore throat Cardiology:  -chest pain, -palpitations, -edema Respiratory: -cough, -shortness of breath, -wheezing Gastroenterology: -abdominal pain, -nausea, -vomiting,  -diarrhea, -constipation Hematology: -bleeding or bruising problems Musculoskeletal: -arthralgias, -myalgias, -joint swelling, -back pain Ophthalmology: -vision changes Urology: -dysuria, -difficulty urinating, -hematuria, -urinary frequency, -urgency Neurology: -headache, -weakness, -tingling, -numbness       Objective:   Physical Exam BP 134/86 (BP Location: Left Arm, Patient Position: Sitting, Cuff Size: Large)   Pulse 77   Temp 97.6 F (36.4 C) (Temporal)   Ht '5\' 3"'$  (1.6 m)   Wt 128 lb (58.1 kg)   SpO2 100%   BMI 22.67 kg/m   General Appearance:    Alert, cooperative, no distress, appears stated age  Head:    Normocephalic, without obvious abnormality, atraumatic  Eyes:    PERRL, conjunctiva/corneas clear, EOM's intact  Ears:    Normal TM's and external ear canals  Nose:   Nares normal, mucosa normal, no drainage   Throat:   Lips, mucosa, and tongue normal  Neck:   Supple, no lymphadenopathy;  thyroid:  no   enlargement/tenderness/nodules; no JVD  Back:    Spine nontender, no curvature, ROM normal, no CVA     tenderness  Lungs:     Clear to auscultation bilaterally without wheezes, rales or     ronchi; respirations unlabored  Chest Scally:    No tenderness or deformity   Heart:    Regular rate and rhythm, S1 and S2 normal, no murmur, rub   or gallop  Breast Exam:    OB/GYN   Abdomen:     Soft, non-tender, nondistended, normoactive bowel sounds,  no masses, no hepatosplenomegaly  Genitalia:    OB/GYN     Extremities:   No clubbing, cyanosis or edema  Pulses:   2+ and symmetric all extremities  Skin:   Skin color, texture, turgor normal, no rashes   Lymph nodes:   Cervical, supraclavicular, and axillary nodes normal  Neurologic:   CNII-XII intact, normal strength, sensation and gait          Psych:   Normal mood, affect, hygiene and grooming.         Assessment & Plan:  Encounter for general adult medical examination with abnormal findings Preventive health care  reviewed.  She sees OB/GYN.  Up-to-date on mammogram, Pap smear and colonoscopy.  Up-to-date with dental and eye exams.  Discussed healthy diet and regular exercise.  Immunizations reviewed.  Discussed safety.  Her mood is good.  Hyperlipidemia, unspecified hyperlipidemia type -Counseling on low-fat, low-cholesterol diet and exercise to help lower cholesterol.  Vitamin B 12 deficiency - Plan: Vitamin B12, Vitamin B12 -She has been taking oral B12 supplements.  Recheck vitamin B12 level.  Osteopenia, unspecified location -Bone density showed osteopenia.  Recommend getting adequate calcium in her diet, taking a vitamin D supplement and weightbearing exercises.  Check vitamin D level  Need for hepatitis C screening test - Plan: Hepatitis C antibody, Hepatitis C antibody -Done per screening guidelines  Medication management - Plan: VITAMIN D 25 Hydroxy (Vit-D Deficiency, Fractures), Vitamin B12, Vitamin B12, VITAMIN D 25 Hydroxy (Vit-D Deficiency, Fractures) -Check vitamin B12 and D levels and adjust supplementation as appropriate  Bruises easily - Plan: CBC with Differential/Platelet, CBC with Differential/Platelet -Repeat CBC.  Platelets normal previously.  Follow-up if worsening  Blood in stool - Plan: CBC with Differential/Platelet, CBC with Differential/Platelet -She will call and schedule with GI

## 2022-06-24 LAB — VITAMIN D 25 HYDROXY (VIT D DEFICIENCY, FRACTURES): VITD: 88.35 ng/mL (ref 30.00–100.00)

## 2022-06-24 LAB — HEPATITIS C ANTIBODY: Hepatitis C Ab: NONREACTIVE

## 2022-06-24 LAB — VITAMIN B12: Vitamin B-12: 1076 pg/mL — ABNORMAL HIGH (ref 211–911)

## 2022-06-29 ENCOUNTER — Telehealth: Payer: Self-pay | Admitting: *Deleted

## 2022-06-29 NOTE — Telephone Encounter (Signed)
Per Dr Hilarie Fredrickson  "this lady needs a Mon or Tues appt with me or APP (APP prob) for intermittent rectal bleeding; only date she isnt available is 07/18/22. thank you"  I have spoken to patient who indicates that she was having some intermittent rectal bleeding but attributes it to beginning metamucil gummies. She indicates that the bleeding was painless and she has continued taking the fiber gummies with no additional bleeding. She is scheduled for appointment with Carl Best on 08/11/22 at 830am.

## 2022-06-30 ENCOUNTER — Ambulatory Visit: Payer: 59 | Admitting: Neurology

## 2022-06-30 ENCOUNTER — Encounter: Payer: Self-pay | Admitting: Neurology

## 2022-06-30 VITALS — BP 160/104 | HR 89 | Ht 62.6 in | Wt 128.0 lb

## 2022-06-30 DIAGNOSIS — G309 Alzheimer's disease, unspecified: Secondary | ICD-10-CM

## 2022-06-30 DIAGNOSIS — R4189 Other symptoms and signs involving cognitive functions and awareness: Secondary | ICD-10-CM

## 2022-06-30 NOTE — Patient Instructions (Signed)
ATN profile to look for Alzheimer disease biomarker  Continue your other medications  Return as needed    There are well-accepted and sensible ways to reduce risk for Alzheimers disease and other degenerative brain disorders .  Exercise Daily Walk A daily 20 minute walk should be part of your routine. Disease related apathy can be a significant roadblock to exercise and the only way to overcome this is to make it a daily routine and perhaps have a reward at the end (something your loved one loves to eat or drink perhaps) or a personal trainer coming to the home can also be very useful. Most importantly, the patient is much more likely to exercise if the caregiver / spouse does it with him/her. In general a structured, repetitive schedule is best.  General Health: Any diseases which effect your body will effect your brain such as a pneumonia, urinary infection, blood clot, heart attack or stroke. Keep contact with your primary care doctor for regular follow ups.  Sleep. A good nights sleep is healthy for the brain. Seven hours is recommended. If you have insomnia or poor sleep habits we can give you some instructions. If you have sleep apnea wear your mask.  Diet: Eating a heart healthy diet is also a good idea; fish and poultry instead of red meat, nuts (mostly non-peanuts), vegetables, fruits, olive oil or canola oil (instead of butter), minimal salt (use other spices to flavor foods), whole grain rice, bread, cereal and pasta and wine in moderation.Research is now showing that the MIND diet, which is a combination of The Mediterranean diet and the DASH diet, is beneficial for cognitive processing and longevity. Information about this diet can be found in The MIND Diet, a book by Doyne Keel, MS, RDN, and online at NotebookDistributors.si  Finances, Power of Attorney and Advance Directives: You should consider putting legal safeguards in place with regard to financial and  medical decision making. While the spouse always has power of attorney for medical and financial issues in the absence of any form, you should consider what you want in case the spouse / caregiver is no longer around or capable of making decisions.

## 2022-06-30 NOTE — Progress Notes (Signed)
[facility]  PATIENT: Nicole [name] DOB: [birth year]  REQUESTING CLINICIAN: Girtha Rm, NP-C HISTORY FROM: Patient  REASON FOR VISIT: Memory complaints    HISTORICAL  CHIEF COMPLAINT:  Chief Complaint  Patient presents with   New Patient (Initial Visit)    Rm 12. Alone. NP/internal referral for memory loss, fhx dementia.    HISTORY OF PRESENT ILLNESS:  This is a 57 year old woman past medical history of B12 deficiency, seasonal allergy who is presenting with memory deficit.  Patient reports having memory deficit since 11-14-2020.  She reported after losing her mother she felt like her memory has not been the same.  She is more forgetful.  Her daughter has complained about her a lot, stating that she is forgetful.  She does live at home with her husband and daughter, independent in all activities of daily living.  She reports strong family history of dementia, mother diagnosed with dementia and passed away in 2020-11-14 and father also diagnosed possibly early with dementia and died at the age of 24.   TBI:   No past history of TBI Stroke:   no past history of stroke Seizures:   no past history of seizures Sleep:   no history of sleep apnea.   Mood:   patient denies anxiety and depression Family history of Dementia: Both patient with dementia but father was diagnosed earlier  Functional status: independent in all ADLs and IADLs Patient lives with husband and daughter . Cooking: patient Cleaning: patient Shopping: patient Bathing: patient Toileting: patient Driving: patient, denies recent accident Bills: patient, denies being late  Ever left the stove on by accident?: Denies Forget how to use items around the house?: Denies Getting lost going to familiar places?: Denies Forgetting loved ones names?: Denies Word finding difficulty? Denies Sleep: Good    OTHER MEDICAL CONDITIONS: Vitamin B12 deficiency    REVIEW OF SYSTEMS: Full 14 system  review of systems performed and negative with exception of: As noted in the HPI   ALLERGIES: Allergies  Allergen Reactions   Penicillins Hives    HOME MEDICATIONS: Outpatient Medications Prior to Visit  Medication Sig Dispense Refill   cyanocobalamin (VITAMIN B12) 1000 MCG tablet      estradiol (ESTRACE) 1 MG tablet Take 1 mg by mouth daily.     gabapentin (NEURONTIN) 100 MG capsule Take 2 capsules (200 mg total) by mouth at bedtime. 180 capsule 0   progesterone (PROMETRIUM) 100 MG capsule Take 100 mg by mouth at bedtime.     Vitamin D, Ergocalciferol, (DRISDOL) 1.25 MG (50000 UNIT) CAPS capsule TAKE 1 CAPSULE (50,000 UNITS TOTAL) BY MOUTH EVERY 7 (SEVEN) DAYS 4 capsule 2   No facility-administered medications prior to visit.    PAST MEDICAL HISTORY: Past Medical History:  Diagnosis Date   Allergy    Seasonal   Nonallopathic lesion of lumbosacral region 06/15/2019    PAST SURGICAL HISTORY: Past Surgical History:  Procedure Laterality Date   ablasion     BREAST SURGERY  25years ago   COLONOSCOPY  ? 2012   Dr.Mann-polyps-recall 5 years per pt   TUBAL LIGATION      FAMILY HISTORY: Family History  Problem Relation Age of Onset   Hypertension Mother    Hypothyroidism Mother    Arthritis Mother    Heart disease Mother    Heart attack Mother    Memory loss Mother    Heart disease Father    Alzheimer's disease Father    Congestive Heart  Failure Father    Hyperlipidemia Father    Hypertension Father    Hypertension Sister    Varicose Veins Sister    Alzheimer's disease Paternal Aunt    Alzheimer's disease Paternal Uncle    Colon polyps Neg Hx    Colon cancer Neg Hx    Esophageal cancer Neg Hx    Stomach cancer Neg Hx    Rectal cancer Neg Hx     SOCIAL HISTORY: Social History   Socioeconomic History   Marital status: Married    Spouse name: Not on file   Number of children: Not on file   Years of education: Not on file   Highest education level: Not on  file  Occupational History   Not on file  Tobacco Use   Smoking status: Former    Packs/day: 1.00    Years: 20.00    Additional pack years: 0.00    Total pack years: 20.00    Types: Cigarettes   Smokeless tobacco: Never  Vaping Use   Vaping Use: Never used  Substance and Sexual Activity   Alcohol use: Yes    Alcohol/week: 2.0 standard drinks of alcohol    Types: 2 Glasses of wine per week   Drug use: No   Sexual activity: Yes    Birth control/protection: Surgical  Other Topics Concern   Not on file  Social History Narrative   Not on file   Social Determinants of Health   Financial Resource Strain: Not on file  Food Insecurity: Not on file  Transportation Needs: Not on file  Physical Activity: Not on file  Stress: Not on file  Social Connections: Not on file  Intimate Partner Violence: Not on file    PHYSICAL EXAM  GENERAL EXAM/CONSTITUTIONAL: Vitals:  Vitals:   06/30/22 1515 06/30/22 1526  BP: (!) 167/92 (!) 160/104  Pulse: 87 89  Weight: 128 lb (58.1 kg)   Height: 5' 2.6" (1.59 m)    Body mass index is 22.96 kg/m. Wt Readings from Last 3 Encounters:  06/30/22 128 lb (58.1 kg)  06/23/22 128 lb (58.1 kg)  05/25/22 131 lb (59.4 kg)   Patient is in no distress; well developed, nourished and groomed; neck is supple   EYES: Visual fields full to confrontation, Extraocular movements intacts,   MUSCULOSKELETAL: Gait, strength, tone, movements noted in Neurologic exam below  NEUROLOGIC: MENTAL STATUS:      No data to display            06/30/2022    3:18 PM  Montreal Cognitive Assessment   Visuospatial/ Executive (0/5) 3  Naming (0/3) 3  Attention: Read list of digits (0/2) 2  Attention: Read list of letters (0/1) 1  Attention: Serial 7 subtraction starting at 100 (0/3) 3  Language: Repeat phrase (0/2) 2  Language : Fluency (0/1) 1  Abstraction (0/2) 2  Delayed Recall (0/5) 5  Orientation (0/6) 6  Total 28  Adjusted Score (based on  education) 28    CRANIAL NERVE:  2nd, 3rd, 4th, 6th- visual fields full to confrontation, extraocular muscles intact, no nystagmus 5th - facial sensation symmetric 7th - facial strength symmetric 8th - hearing intact 9th - palate elevates symmetrically, uvula midline 11th - shoulder shrug symmetric 12th - tongue protrusion midline  MOTOR:  normal bulk and tone, full strength in the BUE, BLE  SENSORY:  normal and symmetric to light touch  COORDINATION:  finger-nose-finger, fine finger movements normal  GAIT/STATION:  normal  DIAGNOSTIC DATA (LABS, IMAGING, TESTING) - I reviewed patient records, labs, notes, testing and imaging myself where available.  Lab Results  Component Value Date   WBC 5.8 06/23/2022   HGB 15.2 (H) 06/23/2022   HCT 45.2 06/23/2022   MCV 95.3 06/23/2022   PLT 243.0 06/23/2022      Component Value Date/Time   NA 139 05/12/2022 0838   K 5.0 05/12/2022 0838   CL 102 05/12/2022 0838   CO2 29 05/12/2022 0838   GLUCOSE 99 05/12/2022 0838   BUN 12 05/12/2022 0838   CREATININE 0.85 05/12/2022 0838   CALCIUM 9.9 05/12/2022 0838   PROT 7.4 05/12/2022 0838   ALBUMIN 4.7 05/12/2022 0838   AST 18 05/12/2022 0838   ALT 12 05/12/2022 0838   ALKPHOS 69 05/12/2022 0838   BILITOT 0.4 05/12/2022 0838   Lab Results  Component Value Date   CHOL 228 (H) 05/12/2022   HDL 67.00 05/12/2022   LDLCALC 141 (H) 05/12/2022   TRIG 97.0 05/12/2022   CHOLHDL 3 05/12/2022   No results found for: "HGBA1C" Lab Results  Component Value Date   VITAMINB12 1,076 (H) 06/23/2022   Lab Results  Component Value Date   TSH 1.72 05/12/2022     ASSESSMENT AND PLAN  57 y.o. year old female with history of B12 deficiency, seasonal allergy who is presenting with subjective memory deficit since August 2022.  At that time she had just lost her mother and states since then her memory has not been the same.  She is independent in all activities of daily living and her  MoCA today is 28.  Due to her strong family history of dementia, I will obtain ATN profile. I will contact patient to go over the results. Continue to follow with PCP return as needed.   1. Subjective memory complaints      Patient Instructions  ATN profile to look for Alzheimer disease biomarker  Continue your other medications  Return as needed    There are well-accepted and sensible ways to reduce risk for Alzheimers disease and other degenerative brain disorders .  Exercise Daily Walk A daily 20 minute walk should be part of your routine. Disease related apathy can be a significant roadblock to exercise and the only way to overcome this is to make it a daily routine and perhaps have a reward at the end (something your loved one loves to eat or drink perhaps) or a personal trainer coming to the home can also be very useful. Most importantly, the patient is much more likely to exercise if the caregiver / spouse does it with him/her. In general a structured, repetitive schedule is best.  General Health: Any diseases which effect your body will effect your brain such as a pneumonia, urinary infection, blood clot, heart attack or stroke. Keep contact with your primary care doctor for regular follow ups.  Sleep. A good nights sleep is healthy for the brain. Seven hours is recommended. If you have insomnia or poor sleep habits we can give you some instructions. If you have sleep apnea wear your mask.  Diet: Eating a heart healthy diet is also a good idea; fish and poultry instead of red meat, nuts (mostly non-peanuts), vegetables, fruits, olive oil or canola oil (instead of butter), minimal salt (use other spices to flavor foods), whole grain rice, bread, cereal and pasta and wine in moderation.Research is now showing that the MIND diet, which is a combination of The Mediterranean diet and the DASH  diet, is beneficial for cognitive processing and longevity. Information about this diet can be found  in The MIND Diet, a book by Doyne Keel, MS, RDN, and online at [employer]bookDist[employer].si  Finances, Power of Attorney and Advance Directives: You should consider putting legal safeguards in place with regard to financial and medical decision making. While the spouse always has power of attorney for medical and financial issues in the absence of any form, you should consider what you want in case the spouse / caregiver is no longer around or capable of making decisions.  Orders Placed This Encounter  Procedures   ATN PROFILE    No orders of the defined types were placed in this encounter.   Return in about 1 year (around 06/30/2023).    Alric Ran, MD 06/30/2022, 3:52 PM  [facility] [address] [place], [place] [address] [phone]

## 2022-07-03 LAB — ATN PROFILE
A -- Beta-amyloid 42/40 Ratio: 0.099 — ABNORMAL LOW (ref >0.102)
Beta-amyloid 40: 191.4 pg/mL
Beta-amyloid 42: 18.92 pg/mL
N -- NfL, Plasma: 2 pg/mL (ref 0.00–3.78)
T -- p-tau181: 0.88 pg/mL (ref 0.00–0.97)

## 2022-07-06 NOTE — Addendum Note (Signed)
Addended byAlric Ran on: 07/06/2022 10:26 AM   Modules accepted: Orders

## 2022-07-07 ENCOUNTER — Telehealth: Payer: Self-pay | Admitting: Neurology

## 2022-07-07 NOTE — Telephone Encounter (Signed)
[employer] no auth required Case Number: [id number] sent to [place]

## 2022-07-17 NOTE — Progress Notes (Signed)
[facility] [address] [place] [address] Phone: [phone] Subjective:   Nicole [name], am serving as a scribe for Dr. Antoine Primas.  I'm seeing this patient by the request  of:  Henson, Vickie L, NP-C  CC: Back and neck pain follow-up  [id number]  Brehanna Barski is a 57 y.o. female coming in with complaint of back and neck pain. OMT 05/25/2022. Patient states 2 1/2 weeks ago was having some right sided neck pain that would shoot up her head that would cause her to stop working. This is not the case at the moment but she is tight in that area.   Medications patient has been prescribed: Gabapentin, Vit D  Taking:         Reviewed prior external information including notes and imaging from previsou exam, outside providers and external EMR if available.   As well as notes that were available from care everywhere and other healthcare systems.  Past medical history, social, surgical and family history all reviewed in electronic medical record.  No pertanent information unless stated regarding to the chief complaint.   Past Medical History:  Diagnosis Date   Allergy    Seasonal   Nonallopathic lesion of lumbosacral region 06/15/2019    Allergies  Allergen Reactions   Penicillins Hives     Review of Systems:  No headache, visual changes, nausea, vomiting, diarrhea, constipation, dizziness, abdominal pain, skin rash, fevers, chills, night sweats, weight loss, swollen lymph nodes, body aches, joint swelling, chest pain, shortness of breath, mood changes. POSITIVE muscle aches  Objective  Blood pressure 112/82, pulse 71, height 5' 2.6" (1.59 m), SpO2 98 %.   General: No apparent distress alert and oriented x3 mood and affect normal, dressed appropriately.  HEENT: Pupils equal, extraocular movements intact  Respiratory: Patient's speak in full sentences and does not appear short of breath  Cardiovascular: No lower extremity edema,  non tender, no erythema  Right side of the neck does have increasing tightness from previous exam.  It was into the very tight shoulder blade.  Tender to palpation in this area.  No pain in the temporal artery.  All tightness seems to be more in the occipital area.  Osteopathic findings  C2 flexed rotated and side bent right C6 flexed rotated and side bent right T3 extended rotated and side bent right inhaled rib T8 extended rotated and side bent right L2 flexed rotated and side bent right Sacrum right on right       Assessment and Plan:  Degenerative disc disease, cervical Known degenerative disc disease that does seem to be caused patient to have some cervicogenic headaches.  Discussed which activities to do and which ones to avoid.  Increase activity slowly.  Discussed icing regimen and home exercises, increase activity slowly.  Follow-up again in 6 to 8 weeks worsening headaches to seek medical attention.    Nonallopathic problems  Decision today to treat with OMT was based on Physical Exam  After verbal consent patient was treated with HVLA, ME, FPR techniques in cervical, rib, thoracic, lumbar, and sacral  areas  Patient tolerated the procedure well with improvement in symptoms  Patient given exercises, stretches and lifestyle modifications  See medications in patient instructions if given  Patient will follow up in 4-8 weeks      The above documentation has been reviewed and is accurate and complete Judi Saa, DO        Note: This dictation was  prepared with Dragon dictation along with smaller phrase technology. Any transcriptional errors that result from this process are unintentional.

## 2022-07-21 ENCOUNTER — Ambulatory Visit: Payer: 59 | Admitting: Family Medicine

## 2022-07-21 ENCOUNTER — Encounter: Payer: Self-pay | Admitting: Family Medicine

## 2022-07-21 VITALS — BP 112/82 | HR 71 | Ht 62.6 in

## 2022-07-21 DIAGNOSIS — M9903 Segmental and somatic dysfunction of lumbar region: Secondary | ICD-10-CM

## 2022-07-21 DIAGNOSIS — M9901 Segmental and somatic dysfunction of cervical region: Secondary | ICD-10-CM

## 2022-07-21 DIAGNOSIS — M9902 Segmental and somatic dysfunction of thoracic region: Secondary | ICD-10-CM

## 2022-07-21 DIAGNOSIS — M9908 Segmental and somatic dysfunction of rib cage: Secondary | ICD-10-CM

## 2022-07-21 DIAGNOSIS — M5030 Other cervical disc degeneration, unspecified cervical region: Secondary | ICD-10-CM | POA: Diagnosis not present

## 2022-07-21 DIAGNOSIS — M9904 Segmental and somatic dysfunction of sacral region: Secondary | ICD-10-CM

## 2022-07-21 MED ORDER — TIZANIDINE HCL 4 MG PO TABS: 4.0000 mg | ORAL_TABLET | Freq: Every day | ORAL | 0 refills | Status: DC

## 2022-07-21 NOTE — Assessment & Plan Note (Addendum)
Known degenerative disc disease that does seem to be caused patient to have some cervicogenic headaches.  Discussed which activities to do and which ones to avoid.  Increase activity slowly.  Discussed icing regimen and home exercises, increase activity slowly.  Follow-up again in 6 to 8 weeks worsening headaches to seek medical attention.  Zanaflex prescribed today

## 2022-07-21 NOTE — Patient Instructions (Addendum)
Good to see you Zanaflex 4mg  at night Keep up the posture Follow up in 6-8 weeks

## 2022-07-29 ENCOUNTER — Encounter (HOSPITAL_COMMUNITY): Payer: Self-pay

## 2022-07-29 ENCOUNTER — Ambulatory Visit (HOSPITAL_COMMUNITY): Admission: RE | Admit: 2022-07-29 | Payer: 59 | Source: Ambulatory Visit

## 2022-08-11 ENCOUNTER — Ambulatory Visit: Payer: 59 | Admitting: Nurse Practitioner

## 2022-08-11 ENCOUNTER — Encounter: Payer: Self-pay | Admitting: Nurse Practitioner

## 2022-08-11 VITALS — BP 110/60 | HR 78 | Ht 63.0 in | Wt 127.0 lb

## 2022-08-11 DIAGNOSIS — K648 Other hemorrhoids: Secondary | ICD-10-CM

## 2022-08-11 DIAGNOSIS — K625 Hemorrhage of anus and rectum: Secondary | ICD-10-CM

## 2022-08-11 MED ORDER — HYDROCORTISONE ACETATE 25 MG RE SUPP: 25.0000 mg | Freq: Every day | RECTAL | 1 refills | Status: DC

## 2022-08-11 NOTE — Progress Notes (Signed)
Addendum: Reviewed and agree with assessment and management plan. Given exam findings today bleeding consistent with internal hemorrhoids.  Agree completely with Metamucil to try to regulate bowel movements and avoid hard stools. If persistent bleeding hemorrhoidal banding would be a good option but may not be necessary if Metamucil can regulate bowel movements I do not think we need to repeat colonoscopy as friable hemorrhoids found on exam today and explains bright red blood with bowel movement Sherleen Pangborn, Carie Caddy, MD

## 2022-08-11 NOTE — Patient Instructions (Addendum)
Take Miralax 1 capful mixed in 8 ounces of water at bed time for constipation as tolerated.  Hold Magnesium supplement for now  Continue Metamucil daily  Drink 8 glasses of water daily   Apply a small amount of Desitin inside the anal opening and to the external anal area three times daily as needed for anal or hemorrhoidal irritation/bleeding.   Anusol HC suppository insert into rectum at bedtime x 5 nights   Sitz bath, soak bottom in warm water x 10 minutes twice daily x 7 days   Contact our office if your rectal bleeding persists or worsens   Follow up with Dr. Rhea Belton in 3 months   Thank you for trusting me with your gastrointestinal care!   Alcide Evener, CRNP

## 2022-08-11 NOTE — Progress Notes (Signed)
08/11/2022 Nicole [name] [id number] April 26, 1965   CHIEF COMPLAINT: Rectal bleeding   HISTORY OF PRESENT ILLNESS: Nicole [name] is a 57 year old female with a past medical history of B12 deficiency, back pain, mild memory impairment and a tubular adenomatous colon polyp. She presents today for further evaluation regarding rectal bleeding and intermittent constipation.  She passed a formed bowel movement 05/2022 and saw a small amount of blood on the stool. The next day, she saw red blood from the rectum which soiled through to her pants. Since then, she occasionally sees drops of bright red blood in the commode or on the toilet tissue with mild anorectal irritation discomfort.  She has intermittent constipation.  She notices if she skips 2 days without passing a bowel movement she will see blood on the next day with a BM.  She denies straining.  She is taking Colace and magnesium supplement at bedtime.  She recently started taking Metamucil daily as well.  She uses Tucks wipes as needed.  She underwent a colonoscopy 07/24/2019 which showed small internal hemorrhoids and one 4 mm tubular adenomatous polyp was removed from the descending colon.  She was advised to repeat a colonoscopy in 7 years.  No known family history of colon polyps or colorectal cancer.      Latest Ref Rng & Units 06/23/2022    8:55 AM 05/12/2022    8:38 AM  CBC  WBC 4.0 - 10.5 K/uL 5.8  5.2   Hemoglobin 12.0 - 15.0 g/dL 40.9  81.1   Hematocrit 36.0 - 46.0 % 45.2  45.4   Platelets 150.0 - 400.0 K/uL 243.0  237.0        Latest Ref Rng & Units 05/12/2022    8:38 AM  CMP  Glucose 70 - 99 mg/dL 99   BUN 6 - 23 mg/dL 12   Creatinine 9.14 - 1.20 mg/dL 7.82   Sodium 956 - 213 mEq/L 139   Potassium 3.5 - 5.1 mEq/L 5.0   Chloride 96 - 112 mEq/L 102   CO2 19 - 32 mEq/L 29   Calcium 8.4 - 10.5 mg/dL 9.9   Total Protein 6.0 - 8.3 g/dL 7.4   Total Bilirubin 0.2 - 1.2 mg/dL 0.4   Alkaline Phos 39 - 117 U/L 69   AST 0 - 37 U/L 18    ALT 0 - 35 U/L 12     Colonoscopy 07/24/2019 by Dr. Rhea Belton: - One 4 mm polyp in the descending colon, removed with a cold snare. Resected and retrieved.  - Small internal hemorrhoids.  - The examination was otherwise normal.  - Recall colonoscopy 7 years - TUBULAR ADENOMA(S). - HIGH GRADE DYSPLASIA IS NOT IDENTIFIED.  Past Medical History:  Diagnosis Date   Allergy    Seasonal   Nonallopathic lesion of lumbosacral region 06/15/2019   Past Surgical History:  Procedure Laterality Date   ablasion     BREAST SURGERY  25years ago   COLONOSCOPY  ? 2012   Dr.Mann-polyps-recall 5 years per pt   TUBAL LIGATION     Social History: She is married.  She has 1 son and 1 daughter.  She is a Information systems manager.  Reports that she has quit smoking. Her smoking use included cigarettes. She has a 20.00 pack-year smoking history. She has never used smokeless tobacco. She reports current alcohol use of about 2.0 standard drinks of alcohol per week. She reports that she does not use drugs.  Family History: family history  includes Alzheimer's disease in her father, paternal aunt, and paternal uncle; Arthritis in her mother; Congestive Heart Failure in her father; Heart attack in her mother; Heart disease in her father and mother; Hyperlipidemia in her father; Hypertension in her father, mother, and sister; Hypothyroidism in her mother; Memory loss in her mother; Varicose Veins in her sister.  Allergies  Allergen Reactions   Penicillins Hives      Outpatient Encounter Medications as of 08/11/2022  Medication Sig   cyanocobalamin (VITAMIN B12) 1000 MCG tablet    estradiol (ESTRACE) 1 MG tablet Take 1 mg by mouth daily.   gabapentin (NEURONTIN) 100 MG capsule Take 2 capsules (200 mg total) by mouth at bedtime.   progesterone (PROMETRIUM) 100 MG capsule Take 100 mg by mouth at bedtime.   tiZANidine (ZANAFLEX) 4 MG tablet Take 1 tablet (4 mg total) by mouth at bedtime.   Vitamin D, Ergocalciferol,  (DRISDOL) 1.25 MG (50000 UNIT) CAPS capsule TAKE 1 CAPSULE (50,000 UNITS TOTAL) BY MOUTH EVERY 7 (SEVEN) DAYS   No facility-administered encounter medications on file as of 08/11/2022.    REVIEW OF SYSTEMS:  Gen: Denies fever, sweats or chills. No weight loss.  CV: Denies chest pain, palpitations or edema. Resp: Denies cough, shortness of breath of hemoptysis.  GI: Denies heartburn, dysphagia, stomach or lower abdominal pain. No diarrhea or constipation.  GU : Denies urinary burning, blood in urine, increased urinary frequency or incontinence. MS: Denies joint pain, muscles aches or weakness. Derm: Denies rash, itchiness, skin lesions or unhealing ulcers. Psych: Mild memory deficit.  Heme: Denies bruising, easy bleeding. Neuro:  Denies headaches, dizziness or paresthesias. Endo:  Denies any problems with DM, thyroid or adrenal function.  PHYSICAL EXAM: General: 57 year old female in no acute distress. Head: Normocephalic and atraumatic. Eyes:  Sclerae non-icteric, conjunctive pink. Ears: Normal auditory acuity. Mouth: Dentition intact. No ulcers or lesions.  Neck: Supple, no lymphadenopathy or thyromegaly.  Lungs: Clear bilaterally to auscultation without wheezes, crackles or rhonchi. Heart: Regular rate and rhythm. No murmur, rub or gallop appreciated.  Abdomen: Soft, nontender, nondistended. No masses. No hepatosplenomegaly. Normoactive bowel sounds x 4 quadrants.  Rectal: Prolapsed internal/anal hemorrhoids, left hemorrhoid friable and beefy red without active bleeding, right hemorrhoid edematous and less friable. Tight anal sphincter. CMA PJ present during exam.  Musculoskeletal: Symmetrical with no gross deformities. Skin: Warm and dry. No rash or lesions on visible extremities. Extremities: No edema. Neurological: Alert oriented x 4, no focal deficits.  Psychological:  Alert and cooperative. Normal mood and affect.  ASSESSMENT AND PLAN: 57 year old female with rectal  bleeding, most likely due to internal hemorrhoids with mild prolapse. -MiraLAX nightly -Hold magnesium supplement for now -Continue Metamucil daily -Drink 64 ounces of water daily -Anusol HC 25mg  suppository 1 PR Q HS x 5 nights -Apply a small amount of Desitin inside the anal opening and to the external anal area three times daily as needed for anal or hemorrhoidal irritation/bleeding.  -Sitz bath with normal water 10 minutes twice daily as needed -Patient to follow-up with Dr. Rhea Belton in 2 to 3 months, to consider internal hemorrhoid banding +/- colonoscopy if rectal bleeding persists or worsens -Patient to contact office if symptoms worsen prior to follow-up appointment date  History of a 4 mm tubular adenomatous polyp removed from the descending colon per colonoscopy 07/2019 -Next colon polyp surveillance colonoscopy due 07/2026   CC:  Hetty Blend L, NP-C

## 2022-08-12 ENCOUNTER — Other Ambulatory Visit: Payer: Self-pay | Admitting: Family Medicine

## 2022-08-25 ENCOUNTER — Other Ambulatory Visit: Payer: Self-pay | Admitting: Family Medicine

## 2022-08-27 NOTE — Progress Notes (Signed)
  Nicole [name] [address] [place] [address] Phone: [phone] Subjective:   Nicole [name], am serving as a scribe for Dr. Antoine [name].  I'm seeing this patient by the request  of:  Henson, Vickie L, NP-C  CC: Back and neck pain follow-up  BJY:[id number]  Nicole [name] is a 57 y.o. female coming in with complaint of back and neck pain. OMT on 07/21/2022. Patient states same per usual. No new concerns.  Medications patient has been prescribed: Zanaflex Vit D  Taking: Yes      Reviewed prior external information including notes and imaging from previsou exam, outside providers and external EMR if available.   As well as notes that were available from care everywhere and other healthcare systems.  Past medical history, social, surgical and family history all reviewed in electronic medical record.  No pertanent information unless stated regarding to the chief complaint.   Past Medical History:  Diagnosis Date   Allergy    Seasonal   Nonallopathic lesion of lumbosacral region 06/15/2019    Allergies  Allergen Reactions   Penicillins Hives     Review of Systems:  No headache, visual changes, nausea, vomiting, diarrhea, constipation, dizziness, abdominal pain, skin rash, fevers, chills, night sweats, weight loss, swollen lymph nodes, body aches, joint swelling, chest pain, shortness of breath, mood changes. POSITIVE muscle aches  Objective  Blood pressure (!) 130/90, pulse 81, height 5\' 3"  (1.6 m), weight 128 lb (58.1 kg), SpO2 98 %.   General: No apparent distress alert and oriented x3 mood and affect normal, dressed appropriately.  HEENT: Pupils equal, extraocular movements intact  Respiratory: Patient's speak in full sentences and does not appear short of breath  Cardiovascular: No lower extremity edema, non tender, no erythema  Low back exam does have some loss lordosis noted.  Some tenderness to palpation noted.  Tightness noted with  [name] right greater than left.  Osteopathic findings  C3 flexed rotated and side bent right C7 flexed rotated and side bent left T3 extended rotated and side bent right inhaled rib T8 extended rotated and side bent left L3 flexed rotated and side bent right Sacrum right on right     Assessment and Plan:  Degenerative disc disease, cervical Neck does have loss of lordosis note,d discussed HEP  Discussed which activities  Discussed hep  Posture as well  RTC in 6-8 weeks     Nonallopathic problems  Decision today to treat with OMT was based on Physical Exam  After verbal consent patient was treated with HVLA, ME, FPR techniques in cervical, rib, thoracic, lumbar, and sacral  areas  Patient tolerated the procedure well with improvement in symptoms  Patient given exercises, stretches and lifestyle modifications  See medications in patient instructions if given  Patient will follow up in 4-8 weeks     The above documentation has been reviewed and is accurate and complete Judi Saa, DO         Note: This dictation was prepared with Dragon dictation along with smaller phrase technology. Any transcriptional errors that result from this process are unintentional.

## 2022-08-31 ENCOUNTER — Ambulatory Visit: Payer: 59 | Admitting: Family Medicine

## 2022-08-31 ENCOUNTER — Encounter: Payer: Self-pay | Admitting: Family Medicine

## 2022-08-31 VITALS — BP 130/90 | HR 81 | Ht 63.0 in | Wt 128.0 lb

## 2022-08-31 DIAGNOSIS — M9901 Segmental and somatic dysfunction of cervical region: Secondary | ICD-10-CM | POA: Diagnosis not present

## 2022-08-31 DIAGNOSIS — M9904 Segmental and somatic dysfunction of sacral region: Secondary | ICD-10-CM

## 2022-08-31 DIAGNOSIS — M9908 Segmental and somatic dysfunction of rib cage: Secondary | ICD-10-CM | POA: Diagnosis not present

## 2022-08-31 DIAGNOSIS — M9903 Segmental and somatic dysfunction of lumbar region: Secondary | ICD-10-CM | POA: Diagnosis not present

## 2022-08-31 DIAGNOSIS — M5030 Other cervical disc degeneration, unspecified cervical region: Secondary | ICD-10-CM

## 2022-08-31 DIAGNOSIS — M9902 Segmental and somatic dysfunction of thoracic region: Secondary | ICD-10-CM

## 2022-08-31 MED ORDER — GABAPENTIN 100 MG PO CAPS: 200.0000 mg | ORAL_CAPSULE | Freq: Every day | ORAL | 0 refills | Status: DC

## 2022-08-31 NOTE — Assessment & Plan Note (Signed)
Neck does have loss of lordosis note,d discussed HEP  Discussed which activities  Discussed hep  Posture as well  RTC in 6-8 weeks

## 2022-08-31 NOTE — Patient Instructions (Addendum)
Gabapentin refilled today.   Follow-up after your trip to [facility]

## 2022-10-20 NOTE — Progress Notes (Signed)
  [facility] [address] [place] [address] Phone: [phone] Subjective:   Nicole [name], am serving as a scribe for Dr. Antoine Primas.  I'm seeing this patient by the request  of:  Henson, Vickie L, NP-C  CC: Back and neck pain follow-up  IEP:[id number]  Nicole [name] is a 57 y.o. female coming in with complaint of back and neck pain. OMT 08/31/2022. Patient states no new concerns. Same per usual. Feeling it mosttoday in right side of neck.  Medications patient has been prescribed: Gabapentin, Vit D, Zanaflex  Taking:         Reviewed prior external information including notes and imaging from previsou exam, outside providers and external EMR if available.   As well as notes that were available from care everywhere and other healthcare systems.  Past medical history, social, surgical and family history all reviewed in electronic medical record.  No pertanent information unless stated regarding to the chief complaint.   Past Medical History:  Diagnosis Date   Allergy    Seasonal   Nonallopathic lesion of lumbosacral region 06/15/2019    Allergies  Allergen Reactions   Penicillins Hives     Review of Systems:  No headache, visual changes, nausea, vomiting, diarrhea, constipation, dizziness, abdominal pain, skin rash, fevers, chills, night sweats, weight loss, swollen lymph nodes, body aches, joint swelling, chest pain, shortness of breath, mood changes. POSITIVE muscle aches  Objective  Blood pressure (!) 138/90, pulse 80, height 5\' 3"  (1.6 m), SpO2 98%.   General: No apparent distress alert and oriented x3 mood and affect normal, dressed appropriately.  HEENT: Pupils equal, extraocular movements intact  Respiratory: Patient's speak in full sentences and does not appear short of breath  Cardiovascular: No lower extremity edema, non tender, no erythema  MSK:  Back tightness noted in the paraspinal musculature. Neck exam shows  tightness noted on the right greater than left.  Patient does have some limited sidebending bilaterally.  Osteopathic findings  C2 flexed rotated and side bent right C6 flexed rotated side bent right C7 flexed rotated and side bent left T3 extended rotated and side bent right inhaled rib L2 flexed rotated and side bent right Sacrum right on right     Assessment and Plan:  Degenerative disc disease, cervical Known degenerative disc disease.  Has had tightness noted in the neck.  We have responded well to osteopathic manipulation previously.  Discussed icing regimen and home exercises, discussed which activities to do and which ones to avoid.  Increase activity slowly otherwise. Patient will be traveling and will follow-up after her international travels    Nonallopathic problems  Decision today to treat with OMT was based on Physical Exam  After verbal consent patient was treated with HVLA, ME, FPR techniques in cervical, rib, thoracic, lumbar, and sacral  areas  Patient tolerated the procedure well with improvement in symptoms  Patient given exercises, stretches and lifestyle modifications  See medications in patient instructions if given  Patient will follow up in 4-8 weeks     The above documentation has been reviewed and is accurate and complete Judi Saa, DO         Note: This dictation was prepared with Dragon dictation along with smaller phrase technology. Any transcriptional errors that result from this process are unintentional.

## 2022-10-26 ENCOUNTER — Encounter: Payer: Self-pay | Admitting: Family Medicine

## 2022-10-26 ENCOUNTER — Ambulatory Visit: Payer: 59 | Admitting: Family Medicine

## 2022-10-26 VITALS — BP 138/90 | HR 80 | Ht 63.0 in

## 2022-10-26 DIAGNOSIS — M9901 Segmental and somatic dysfunction of cervical region: Secondary | ICD-10-CM

## 2022-10-26 DIAGNOSIS — M9904 Segmental and somatic dysfunction of sacral region: Secondary | ICD-10-CM

## 2022-10-26 DIAGNOSIS — M9902 Segmental and somatic dysfunction of thoracic region: Secondary | ICD-10-CM

## 2022-10-26 DIAGNOSIS — M9908 Segmental and somatic dysfunction of rib cage: Secondary | ICD-10-CM

## 2022-10-26 DIAGNOSIS — M5030 Other cervical disc degeneration, unspecified cervical region: Secondary | ICD-10-CM | POA: Diagnosis not present

## 2022-10-26 DIAGNOSIS — M9903 Segmental and somatic dysfunction of lumbar region: Secondary | ICD-10-CM | POA: Diagnosis not present

## 2022-10-26 MED ORDER — PREDNISONE 20 MG PO TABS: 40.0000 mg | ORAL_TABLET | Freq: Every day | ORAL | 0 refills | Status: DC

## 2022-10-26 NOTE — Assessment & Plan Note (Signed)
Known degenerative disc disease.  Has had tightness noted in the neck.  We have responded well to osteopathic manipulation previously.  Discussed icing regimen and home exercises, discussed which activities to do and which ones to avoid.  Increase activity slowly otherwise. Patient will be traveling and will follow-up after her international travels

## 2022-10-26 NOTE — Patient Instructions (Addendum)
Prednisone 40mg  for 5 days Muscle relaxer for flight See you again in 5 weeks

## 2022-11-19 ENCOUNTER — Other Ambulatory Visit: Payer: Self-pay | Admitting: Family Medicine

## 2022-12-01 NOTE — Progress Notes (Unsigned)
  [facility] [address] [place] [address] Phone: [phone] Subjective:   Bruce Donath, am serving as a scribe for Dr. Antoine Primas.  I'm seeing this patient by the request  of:  Henson, Vickie L, NP-C  CC: Neck and back pain follow-up  [id number]  Nicole [name] is a 57 y.o. female coming in with complaint of back and neck pain. OMT on 10/26/2022. Patient states that she is the same as last visit.   Medications patient has been prescribed: Prednisone Vit D          Reviewed prior external information including notes and imaging from previsou exam, outside providers and external EMR if available.   As well as notes that were available from care everywhere and other healthcare systems.  Past medical history, social, surgical and family history all reviewed in electronic medical record.  No pertanent information unless stated regarding to the chief complaint.   Past Medical History:  Diagnosis Date   Allergy    Seasonal   Nonallopathic lesion of lumbosacral region 06/15/2019    Allergies  Allergen Reactions   Penicillins Hives     Review of Systems:  No headache, visual changes, nausea, vomiting, diarrhea, constipation, dizziness, abdominal pain, skin rash, fevers, chills, night sweats, weight loss, swollen lymph nodes, body aches, joint swelling, chest pain, shortness of breath, mood changes. POSITIVE muscle aches  Objective  Blood pressure (!) 142/102, pulse 75, height 5\' 3"  (1.6 m), weight 127 lb (57.6 kg), SpO2 96%.   General: No apparent distress alert and oriented x3 mood and affect normal, dressed appropriately.  HEENT: Pupils equal, extraocular movements intact  Respiratory: Patient's speak in full sentences and does not appear short of breath  Cardiovascular: No lower extremity edema, non tender, no erythema   Neck exam shows loss of lordosis patient does have tightness noted in the neck today.  Seems to be a  little tighter than usual but patient has been traveling.  Osteopathic findings  C3 flexed rotated and side bent right C5 flexed rotated and side bent left T3 extended rotated and side bent right inhaled rib T5 extended rotated and side bent left L2 flexed rotated and side bent right Sacrum right on right    Assessment and Plan:  Degenerative disc disease, cervical Patient does have loss of lordosis.  Some degenerative disc disease.  Has done well though with the conservative therapy.  Discussed icing regimen and home exercises.  Increase activity slowly over the course the next several weeks.  No change in medications.  Refilled gabapentin.  Follow-up again in 6 to 8 weeks    Nonallopathic problems  Decision today to treat with OMT was based on Physical Exam  After verbal consent patient was treated with HVLA, ME, FPR techniques in cervical, rib, thoracic, lumbar, and sacral  areas  Patient tolerated the procedure well with improvement in symptoms  Patient given exercises, stretches and lifestyle modifications  See medications in patient instructions if given  Patient will follow up in 4-8 weeks    The above documentation has been reviewed and is accurate and complete Judi Saa, DO          Note: This dictation was prepared with Dragon dictation along with smaller phrase technology. Any transcriptional errors that result from this process are unintentional.

## 2022-12-02 ENCOUNTER — Encounter: Payer: Self-pay | Admitting: Family Medicine

## 2022-12-02 ENCOUNTER — Ambulatory Visit: Payer: 59 | Admitting: Family Medicine

## 2022-12-02 VITALS — BP 142/102 | HR 75 | Ht 63.0 in | Wt 127.0 lb

## 2022-12-02 DIAGNOSIS — M9903 Segmental and somatic dysfunction of lumbar region: Secondary | ICD-10-CM

## 2022-12-02 DIAGNOSIS — M9901 Segmental and somatic dysfunction of cervical region: Secondary | ICD-10-CM

## 2022-12-02 DIAGNOSIS — M9904 Segmental and somatic dysfunction of sacral region: Secondary | ICD-10-CM | POA: Diagnosis not present

## 2022-12-02 DIAGNOSIS — M9902 Segmental and somatic dysfunction of thoracic region: Secondary | ICD-10-CM | POA: Diagnosis not present

## 2022-12-02 DIAGNOSIS — M5030 Other cervical disc degeneration, unspecified cervical region: Secondary | ICD-10-CM | POA: Diagnosis not present

## 2022-12-02 DIAGNOSIS — M9908 Segmental and somatic dysfunction of rib cage: Secondary | ICD-10-CM | POA: Diagnosis not present

## 2022-12-02 MED ORDER — GABAPENTIN 100 MG PO CAPS: 200.0000 mg | ORAL_CAPSULE | Freq: Every day | ORAL | 0 refills | Status: DC

## 2022-12-02 NOTE — Assessment & Plan Note (Addendum)
Patient does have loss of lordosis.  Some degenerative disc disease.  Has done well though with the conservative therapy.  Discussed icing regimen and home exercises.  Increase activity slowly over the course the next several weeks.  No change in medications.  Refilled gabapentin.  Follow-up again in 6 to 8 weeks

## 2022-12-02 NOTE — Patient Instructions (Signed)
Glad you are back Gabapentin refilled See me in 6-8 weeks

## 2022-12-07 ENCOUNTER — Ambulatory Visit: Payer: 59 | Admitting: Internal Medicine

## 2022-12-07 ENCOUNTER — Encounter: Payer: Self-pay | Admitting: Internal Medicine

## 2022-12-07 VITALS — BP 132/88 | HR 83 | Ht 63.0 in | Wt 128.2 lb

## 2022-12-07 DIAGNOSIS — K642 Third degree hemorrhoids: Secondary | ICD-10-CM

## 2022-12-07 NOTE — Patient Instructions (Addendum)
Please purchase the following medications over the counter and take as directed: Metamucil and Colace daily.   HEMORRHOID BANDING PROCEDURE    FOLLOW-UP CARE   The procedure you have had should have been relatively painless since the banding of the area involved does not have nerve endings and there is no pain sensation.  The rubber band cuts off the blood supply to the hemorrhoid and the band may fall off as soon as 48 hours after the banding (the band may occasionally be seen in the toilet bowl following a bowel movement). You may notice a temporary feeling of fullness in the rectum which should respond adequately to plain Tylenol or Motrin.  Following the banding, avoid strenuous exercise that evening and resume full activity the next day.  A sitz bath (soaking in a warm tub) or bidet is soothing, and can be useful for cleansing the area after bowel movements.     To avoid constipation, take two tablespoons of natural wheat bran, natural oat bran, flax, Benefiber or any over the counter fiber supplement and increase your water intake to 7-8 glasses daily.    Unless you have been prescribed anorectal medication, do not put anything inside your rectum for two weeks: No suppositories, enemas, fingers, etc.  Occasionally, you may have more bleeding than usual after the banding procedure.  This is often from the untreated hemorrhoids rather than the treated one.  Don't be concerned if there is a tablespoon or so of blood.  If there is more blood than this, lie flat with your bottom higher than your head and apply an ice pack to the area. If the bleeding does not stop within a half an hour or if you feel faint, call our office at [phone] or go to the emergency room.  Problems are not common; however, if there is a substantial amount of bleeding, severe pain, chills, fever or difficulty passing urine (very rare) or other problems, you should call us at [phone] or report to the nearest  emergency room.  Do not stay seated continuously for more than 2-3 hours for a day or two after the procedure.  Tighten your buttock muscles 10-15 times every two hours and take 10-15 deep breaths every 1-2 hours.  Do not spend more than a few minutes on the toilet if you cannot empty your bowel; instead re-visit the toilet at a later time.   _______________________________________________________  If your blood pressure at your visit was 140/90 or greater, please contact your primary care physician to follow up on this.  _______________________________________________________  If you are age 72 or older, your body mass index should be between 23-30. Your Body mass index is 22.72 kg/m. If this is out of the aforementioned range listed, please consider follow up with your Primary Care Provider.  If you are age 66 or younger, your body mass index should be between 19-25. Your Body mass index is 22.72 kg/m. If this is out of the aformentioned range listed, please consider follow up with your Primary Care Provider.   ________________________________________________________  The [facility] providers would like to encourage you to use [facility] to communicate with providers for non-urgent requests or questions.  Due to long hold times on the telephone, sending your provider a message by [facility] may be a faster and more efficient way to get a response.  Please allow 48 business hours for a response.  Please remember that this is for non-urgent requests.  _______________________________________________________

## 2022-12-07 NOTE — Progress Notes (Signed)
Patient presenting for hemorrhoidal banding Seen recently by Alcide Evener  Symptoms include prolapsed, bleeding and swollen internal hemorrhoids Using Tucks medicated pads with some benefit  Using Metamucil Gummies and Colace for occasional constipation   PROCEDURE NOTE:  The patient presents with symptomatic grade 3 internal hemorrhoids, requesting rubber band ligation of her hemorrhoidal disease.  All risks, benefits and alternative forms of therapy were described and informed consent was obtained.   The anorectum was pre-medicated with 0.125% nitroglycerin ointment The decision was made to band the LL internal hemorrhoid, and the [facility] O'Regan System was used to perform band ligation without complication.   Digital anorectal examination was then performed to assure proper positioning of the band, and to adjust the banded tissue as required.  The patient was discharged home without pain or other issues.  Dietary and behavioral recommendations were given and along with follow-up instructions.     The following adjunctive treatments were recommended: Continue Metamucil and Colace on a regular basis  The patient will return as scheduled for follow-up and possible additional banding as required. No complications were encountered and the patient tolerated the procedure well.

## 2022-12-08 ENCOUNTER — Ambulatory Visit: Payer: 59 | Admitting: Family Medicine

## 2023-01-08 NOTE — Progress Notes (Deleted)
  [facility] [address] [place] [address] Phone: [phone] Subjective:    I'm seeing this patient by the request  of:  Suezanne Jacquet, Vickie L, NP-C  CC:   [id number]  Nicole [name] is a 57 y.o. female coming in with complaint of back and neck pain. OMT on 12/02/2022. Patient states   Medications patient has been prescribed: gabapentin  Taking:         Reviewed prior external information including notes and imaging from previsou exam, outside providers and external EMR if available.   As well as notes that were available from care everywhere and other healthcare systems.  Past medical history, social, surgical and family history all reviewed in electronic medical record.  No pertanent information unless stated regarding to the chief complaint.   Past Medical History:  Diagnosis Date   Allergy    Seasonal   Nonallopathic lesion of lumbosacral region 06/15/2019    Allergies  Allergen Reactions   Penicillins Hives     Review of Systems:  No headache, visual changes, nausea, vomiting, diarrhea, constipation, dizziness, abdominal pain, skin rash, fevers, chills, night sweats, weight loss, swollen lymph nodes, body aches, joint swelling, chest pain, shortness of breath, mood changes. POSITIVE muscle aches  Objective  There were no vitals taken for this visit.   General: No apparent distress alert and oriented x3 mood and affect normal, dressed appropriately.  HEENT: Pupils equal, extraocular movements intact  Respiratory: Patient's speak in full sentences and does not appear short of breath  Cardiovascular: No lower extremity edema, non tender, no erythema  Gait MSK:  Back   Osteopathic findings  C2 flexed rotated and side bent right C6 flexed rotated and side bent left T3 extended rotated and side bent right inhaled rib T9 extended rotated and side bent left L2 flexed rotated and side bent right Sacrum right on  right       Assessment and Plan:  No problem-specific Assessment & Plan notes found for this encounter.    Nonallopathic problems  Decision today to treat with OMT was based on Physical Exam  After verbal consent patient was treated with HVLA, ME, FPR techniques in cervical, rib, thoracic, lumbar, and sacral  areas  Patient tolerated the procedure well with improvement in symptoms  Patient given exercises, stretches and lifestyle modifications  See medications in patient instructions if given  Patient will follow up in 4-8 weeks             Note: This dictation was prepared with Dragon dictation along with smaller phrase technology. Any transcriptional errors that result from this process are unintentional.

## 2023-01-12 ENCOUNTER — Ambulatory Visit: Payer: 59 | Admitting: Family Medicine

## 2023-01-26 ENCOUNTER — Encounter: Payer: 59 | Admitting: Internal Medicine

## 2023-03-23 ENCOUNTER — Ambulatory Visit: Payer: 59 | Admitting: Internal Medicine

## 2023-03-23 ENCOUNTER — Encounter: Payer: Self-pay | Admitting: Internal Medicine

## 2023-03-23 VITALS — BP 124/70 | HR 58 | Ht 63.0 in | Wt 133.0 lb

## 2023-03-23 DIAGNOSIS — K642 Third degree hemorrhoids: Secondary | ICD-10-CM

## 2023-03-23 NOTE — Patient Instructions (Signed)
HEMORRHOID BANDING PROCEDURE    FOLLOW-UP CARE   The procedure you have had should have been relatively painless since the banding of the area involved does not have nerve endings and there is no pain sensation.  The rubber band cuts off the blood supply to the hemorrhoid and the band may fall off as soon as 48 hours after the banding (the band may occasionally be seen in the toilet bowl following a bowel movement). You may notice a temporary feeling of fullness in the rectum which should respond adequately to plain Tylenol or Motrin.  Following the banding, avoid strenuous exercise that evening and resume full activity the next day.  A sitz bath (soaking in a warm tub) or bidet is soothing, and can be useful for cleansing the area after bowel movements.     To avoid constipation, take two tablespoons of natural wheat bran, natural oat bran, flax, Benefiber or any over the counter fiber supplement and increase your water intake to 7-8 glasses daily.    Unless you have been prescribed anorectal medication, do not put anything inside your rectum for two weeks: No suppositories, enemas, fingers, etc.  Occasionally, you may have more bleeding than usual after the banding procedure.  This is often from the untreated hemorrhoids rather than the treated one.  Don't be concerned if there is a tablespoon or so of blood.  If there is more blood than this, lie flat with your bottom higher than your head and apply an ice pack to the area. If the bleeding does not stop within a half an hour or if you feel faint, call our office at [phone] or go to the emergency room.  Problems are not common; however, if there is a substantial amount of bleeding, severe pain, chills, fever or difficulty passing urine (very rare) or other problems, you should call us at [phone] or report to the nearest emergency room.  Do not stay seated continuously for more than 2-3 hours for a day or two after the procedure.   Tighten your buttock muscles 10-15 times every two hours and take 10-15 deep breaths every 1-2 hours.  Do not spend more than a few minutes on the toilet if you cannot empty your bowel; instead re-visit the toilet at a later time.     _______________________________________________________  If your blood pressure at your visit was 140/90 or greater, please contact your primary care physician to follow up on this.  _______________________________________________________  If you are age 39 or older, your body mass index should be between 23-30. Your Body mass index is 23.56 kg/m. If this is out of the aforementioned range listed, please consider follow up with your Primary Care Provider.  If you are age 44 or younger, your body mass index should be between 19-25. Your Body mass index is 23.56 kg/m. If this is out of the aformentioned range listed, please consider follow up with your Primary Care Provider.   ________________________________________________________  The [facility] providers would like to encourage you to use [facility] to communicate with providers for non-urgent requests or questions.  Due to long hold times on the telephone, sending your provider a message by [facility] may be a faster and more efficient way to get a response.  Please allow 48 business hours for a response.  Please remember that this is for non-urgent requests.  _______________________________________________________ Thank you for choosing me and [facility].  Dr. Meridee Score

## 2023-03-23 NOTE — Progress Notes (Signed)
Nicole [name] is a 57 year old female presenting for additional hemorrhoidal banding  Banding visit #2 Initial banding on 12/07/2022 to the left lateral internal hemorrhoid  Symptoms have significantly improved  Symptoms prior to banding include: Prolapse, bleeding and swelling  She is doing well with her Metamucil Gummies and Colace   PROCEDURE NOTE:  The patient presents with symptomatic grade 3 internal hemorrhoids, requesting rubber band ligation of her hemorrhoidal disease.  All risks, benefits and alternative forms of therapy were described and informed consent was obtained.   The anorectum was pre-medicated with 0.125% nitroglycerin ointment The decision was made to band the RA (LL banded at Visit #1 with good result) internal hemorrhoid, and the [facility] O'Regan System was used to perform band ligation without complication.   Digital anorectal examination was then performed to assure proper positioning of the band, and to adjust the banded tissue as required.  The patient was discharged home without pain or other issues.  Dietary and behavioral recommendations were given and along with follow-up instructions.     The following adjunctive treatments were recommended: Continue Metamucil and Colace score loss substernal  The patient will return as scheduled for follow-up and possible additional banding as required. No complications were encountered and the patient tolerated the procedure well.

## 2023-04-22 ENCOUNTER — Ambulatory Visit: Payer: 59 | Admitting: Internal Medicine

## 2023-04-22 ENCOUNTER — Encounter: Payer: Self-pay | Admitting: Internal Medicine

## 2023-04-22 VITALS — BP 110/80 | HR 79 | Ht 63.0 in | Wt 135.0 lb

## 2023-04-22 DIAGNOSIS — K642 Third degree hemorrhoids: Secondary | ICD-10-CM

## 2023-04-22 NOTE — Progress Notes (Signed)
 Nicole [name] is a 58 year old female presenting for additional hemorrhoidal banding  Banding visit #3; previously banded left lateral and right anterior internal hemorrhoids  Symptoms prior to banding include: Prolapse, bleeding and swelling  She is continue Metamucil Gummies and Colace  Some minor prolapse and bleeding remain though overall improvement   PROCEDURE NOTE:  The patient presents with symptomatic grade 3 internal hemorrhoids, requesting rubber band ligation of her hemorrhoidal disease.  All risks, benefits and alternative forms of therapy were described and informed consent was obtained.   The anorectum was pre-medicated with 0.125% nitroglycerin ointment The decision was made to band the RP internal hemorrhoid, and the [facility] O'Regan System was used to perform band ligation without complication.   Digital anorectal examination was then performed to assure proper positioning of the band, and to adjust the banded tissue as required.  The patient was discharged home without pain or other issues.  Dietary and behavioral recommendations were given and along with follow-up instructions.     The following adjunctive treatments were recommended: Continue Metamucil and Colace  The patient will return as needed for follow-up and possible additional banding as required. No complications were encountered and the patient tolerated the procedure well.

## 2023-04-22 NOTE — Patient Instructions (Signed)

## 2023-06-04 ENCOUNTER — Telehealth: Payer: Self-pay

## 2023-06-04 ENCOUNTER — Other Ambulatory Visit: Payer: Self-pay

## 2023-06-04 DIAGNOSIS — K625 Hemorrhage of anus and rectum: Secondary | ICD-10-CM

## 2023-06-04 NOTE — Telephone Encounter (Signed)
 Patient confirmed that she can come on Monday 06/07/23 at 9 am for her Flex Sig. Informed patient I will put her instructions on MyChart for her to follow and if she has any questions for her to contact our office. Patient verbalized understanding.

## 2023-06-04 NOTE — Telephone Encounter (Signed)
 Spoke with patient and explained Dr. Lauro Franklin recommendations. Patient agreed to have flex sig on 06/07/23 at 9:00am. Patient states she needs to discuss with her husband since he will be driving her to the appt and return my call.

## 2023-06-04 NOTE — Telephone Encounter (Signed)
 With recent hemorrhoidal banding and recurrent rectal bleeding I would like to perform flexible sigmoidoscopy to exclude post banding ulcer and bleeding and evaluate for other causes of rectal bleeding Please proceed with flexible sigmoidoscopy with standard prep on Monday in the open slot

## 2023-06-04 NOTE — Telephone Encounter (Signed)
 Patient reports she has intermittent bleeding and blood clots after a BM that started 2 weeks ago. Patient reports she takes Metamucil and a stool softener daily and has a BM every day. Patient states she does not have bleeding every day. Patient tried to call and make a sooner appointment for a banding and was told your first available is in May. Please advise.

## 2023-06-07 ENCOUNTER — Ambulatory Visit (AMBULATORY_SURGERY_CENTER): Payer: 59 | Admitting: Internal Medicine

## 2023-06-07 VITALS — BP 146/95 | HR 77 | Temp 98.0°F | Resp 11 | Ht 63.0 in | Wt 128.4 lb

## 2023-06-07 DIAGNOSIS — K625 Hemorrhage of anus and rectum: Secondary | ICD-10-CM | POA: Diagnosis not present

## 2023-06-07 DIAGNOSIS — K626 Ulcer of anus and rectum: Secondary | ICD-10-CM | POA: Diagnosis not present

## 2023-06-07 DIAGNOSIS — K6282 Dysplasia of anus: Secondary | ICD-10-CM | POA: Diagnosis present

## 2023-06-07 DIAGNOSIS — K629 Disease of anus and rectum, unspecified: Secondary | ICD-10-CM

## 2023-06-07 DIAGNOSIS — K648 Other hemorrhoids: Secondary | ICD-10-CM

## 2023-06-07 DIAGNOSIS — K5730 Diverticulosis of large intestine without perforation or abscess without bleeding: Secondary | ICD-10-CM | POA: Diagnosis not present

## 2023-06-07 MED ORDER — SODIUM CHLORIDE 0.9 % IV SOLN: 500.0000 mL | Freq: Once | INTRAVENOUS | Status: DC

## 2023-06-07 NOTE — Progress Notes (Signed)
 Called to room to assist during endoscopic procedure.  Patient ID and intended procedure confirmed with present staff. Received instructions for my participation in the procedure from the performing physician.

## 2023-06-07 NOTE — Patient Instructions (Signed)
 Discharge instructions given. Biopsies take. Handouts on Diverticulosis and Hemorrhoids. Resume previous medications. YOU HAD AN ENDOSCOPIC PROCEDURE TODAY AT THE [facility]:   Refer to the procedure report that was given to you for any specific questions about what was found during the examination.  If the procedure report does not answer your questions, please call your gastroenterologist to clarify.  If you requested that your care partner not be given the details of your procedure findings, then the procedure report has been included in a sealed envelope for you to review at your convenience later.  YOU SHOULD EXPECT: Some feelings of bloating in the abdomen. Passage of more gas than usual.  Walking can help get rid of the air that was put into your GI tract during the procedure and reduce the bloating. If you had a lower endoscopy (such as a colonoscopy or flexible sigmoidoscopy) you may notice spotting of blood in your stool or on the toilet paper. If you underwent a bowel prep for your procedure, you may not have a normal bowel movement for a few days.  Please Note:  You might notice some irritation and congestion in your nose or some drainage.  This is from the oxygen used during your procedure.  There is no need for concern and it should clear up in a day or so.  SYMPTOMS TO REPORT IMMEDIATELY:  Following lower endoscopy (colonoscopy or flexible sigmoidoscopy):  Excessive amounts of blood in the stool  Significant tenderness or worsening of abdominal pains  Swelling of the abdomen that is new, acute  Fever of 100F or higher   For urgent or emergent issues, a gastroenterologist can be reached at any hour by calling [phone]. Do not use MyChart messaging for urgent concerns.    DIET:  We do recommend a small meal at first, but then you may proceed to your regular diet.  Drink plenty of fluids but you should avoid alcoholic beverages for 24 hours.  ACTIVITY:  You  should plan to take it easy for the rest of today and you should NOT DRIVE or use heavy machinery until tomorrow (because of the sedation medicines used during the test).    FOLLOW UP: Our staff will call the number listed on your records the next business day following your procedure.  We will call around 7:15- 8:00 am to check on you and address any questions or concerns that you may have regarding the information given to you following your procedure. If we do not reach you, we will leave a message.     If any biopsies were taken you will be contacted by phone or by letter within the next 1-3 weeks.  Please call us at [phone] if you have not heard about the biopsies in 3 weeks.    SIGNATURES/CONFIDENTIALITY: You and/or your care partner have signed paperwork which will be entered into your electronic medical record.  These signatures attest to the fact that that the information above on your After Visit Summary has been reviewed and is understood.  Full responsibility of the confidentiality of this discharge information lies with you and/or your care-partner.

## 2023-06-07 NOTE — Op Note (Signed)
 [facility] Patient Name: Nicole [name] Procedure Date: 06/07/2023 8:41 AM MRN: [id number] Endoscopist: Beverley Fiedler , MD, [id number] Age: 58 Referring MD:  Date of Birth: 04-Sep-1965 Gender: Female Account #: [id number] Procedure:                Flexible Sigmoidoscopy Indications:              Rectal bleeding after internal hemorrhoidal banding                            x 3 (last 04/22/2023), painless in nature, still                            perianal pressure symptom post-bandings Medicines:                Monitored Anesthesia Care Procedure:                Pre-Anesthesia Assessment:                           - Prior to the procedure, a History and Physical                            was performed, and patient medications and                            allergies were reviewed. The patient's tolerance of                            previous anesthesia was also reviewed. The risks                            and benefits of the procedure and the sedation                            options and risks were discussed with the patient.                            All questions were answered, and informed consent                            was obtained. Prior Anticoagulants: The patient has                            taken no anticoagulant or antiplatelet agents. ASA                            Grade Assessment: I - A normal, healthy patient.                            After reviewing the risks and benefits, the patient                            was deemed in satisfactory condition to undergo the  procedure.                           After obtaining informed consent, the scope was                            passed under direct vision. The Olympus Scope                            [id number] was introduced through the anus and                            advanced to the descending colon. The flexible                            sigmoidoscopy was accomplished without  difficulty.                            The patient tolerated the procedure well. The                            quality of the bowel preparation was excellent. Scope In: 8:46:54 AM Scope Out: 8:53:17 AM Total Procedure Duration: 0 hours 6 minutes 23 seconds  Findings:                 Small, reducible, internal hemorrhoid was found on                            perianal exam (right posterior).                           Multiple small-mouthed diverticula were found in                            the sigmoid colon and distal descending colon.                           A single (solitary) four mm ulcer was found in the                            distal rectum in the right posterior position. No                            bleeding was present. No stigmata of recent                            bleeding were seen and therefore clip was not                            placed.                           Two localized area of granular mucosa was found at  the dentate line. Biopsies were taken with a cold                            forceps for histology to exclude AIN.                           Internal hemorrhoid was found during retroflexion.                            The hemorrhoid is located in the right posterior                            position and was small. Complications:            No immediate complications. Estimated Blood Loss:     Estimated blood loss was minimal. Impression:               - Mild diverticulosis in the sigmoid colon and in                            the distal descending colon.                           - A single (solitary) ulcer in the distal rectum in                            right posterior position from hemorrhoidal banding                            6 weeks ago. Nonbleeding.                           - Granularity at the anus in 2 locations. Biopsied                            to exclude AIN.                           - Small residual  right posterior internal                            hemorrhoid (quite distal very near proximal anal                            canal). If continued bleeding there appears to be a                            target for 1 addition rubber-band in between the                            right posterior and left lateral location. Recommendation:           - Patient has a contact number available for  emergencies. The signs and symptoms of potential                            delayed complications were discussed with the                            patient. Return to normal activities tomorrow.                            Written discharge instructions were provided to the                            patient.                           - Resume previous diet.                           - Await pathology results.                           - Repeat banding visit x 1 if recurrent or                            persistent bleeding or prolapse symptom. If                            additional banding not helpful would need surgical                            opinion for recurrent symptoms. Beverley Fiedler, MD 06/07/2023 9:04:10 AM This report has been signed electronically.

## 2023-06-07 NOTE — Progress Notes (Signed)
 GASTROENTEROLOGY PROCEDURE H&P NOTE   Primary Care Physician: Avanell Shackleton, NP-C    Reason for Procedure:  Persistent rectal bleeding 4 to 6 weeks post hemorrhoidal banding #3, rectal pain  Plan:    Flexible sigmoidoscopy  Patient is appropriate for endoscopic procedure(s) in the ambulatory ([facility]) setting.  The nature of the procedure, as well as the risks, benefits, and alternatives were carefully and thoroughly reviewed with the patient. Ample time for discussion and questions allowed. The patient understood, was satisfied, and agreed to proceed.     HPI: Nicole [name] is a 58 y.o. female who presents for flexible sigmoidoscopy.  Medical history as below.  Tolerated the prep.  No recent chest pain or shortness of breath.  No abdominal pain today.  Past Medical History:  Diagnosis Date   Allergy    Seasonal   Nonallopathic lesion of lumbosacral region 06/15/2019    Past Surgical History:  Procedure Laterality Date   ablasion     BREAST SURGERY  25years ago   COLONOSCOPY  ? 2012   Dr.Mann-polyps-recall 5 years per pt   TUBAL LIGATION      Prior to Admission medications   Medication Sig Start Date End Date Taking? Authorizing Provider  cyanocobalamin (VITAMIN B12) 1000 MCG tablet Take 1,000 mcg by mouth daily. 05/25/22   [provider]  docusate sodium (COLACE) 50 MG capsule Take 50 mg by mouth daily.    [provider]  estradiol (ESTRACE) 1 MG tablet Take 1 mg by mouth daily. 05/09/22   [provider]  MAGNESIUM PO Take 1 capsule by mouth daily.    [provider]  progesterone (PROMETRIUM) 100 MG capsule Take 100 mg by mouth at bedtime. 05/11/22   [provider]  tiZANidine (ZANAFLEX) 4 MG tablet TAKE 1 TABLET BY MOUTH AT BEDTIME. 08/12/22   Judi Saa, DO  Vitamin D, Ergocalciferol, (DRISDOL) 1.25 MG (50000 UNIT) CAPS capsule TAKE 1 CAPSULE (50,000 UNITS TOTAL) BY MOUTH EVERY 7 (SEVEN) DAYS 11/19/22   Judi Saa,  DO    Current Outpatient Medications  Medication Sig Dispense Refill   cyanocobalamin (VITAMIN B12) 1000 MCG tablet Take 1,000 mcg by mouth daily.     docusate sodium (COLACE) 50 MG capsule Take 50 mg by mouth daily.     estradiol (ESTRACE) 1 MG tablet Take 1 mg by mouth daily.     MAGNESIUM PO Take 1 capsule by mouth daily.     progesterone (PROMETRIUM) 100 MG capsule Take 100 mg by mouth at bedtime.     tiZANidine (ZANAFLEX) 4 MG tablet TAKE 1 TABLET BY MOUTH AT BEDTIME. 90 tablet 1   Vitamin D, Ergocalciferol, (DRISDOL) 1.25 MG (50000 UNIT) CAPS capsule TAKE 1 CAPSULE (50,000 UNITS TOTAL) BY MOUTH EVERY 7 (SEVEN) DAYS 4 capsule 2   No current facility-administered medications for this visit.    Allergies as of 06/07/2023 - Review Complete 06/07/2023  Allergen Reaction Noted   Penicillins Hives 10/06/2012    Family History  Problem Relation Age of Onset   Hypertension Mother    Hypothyroidism Mother    Arthritis Mother    Heart disease Mother    Heart attack Mother    Memory loss Mother    Heart disease Father    Alzheimer's disease Father    Congestive Heart Failure Father    Hyperlipidemia Father    Hypertension Father    Hypertension Sister    Varicose Veins Sister    Alzheimer's disease  Paternal Aunt    Alzheimer's disease Paternal Uncle    Colon polyps Neg Hx    Colon cancer Neg Hx    Esophageal cancer Neg Hx    Stomach cancer Neg Hx    Rectal cancer Neg Hx     Social History   Socioeconomic History   Marital status: Married    Spouse name: Not on file   Number of children: 2   Years of education: Not on file   Highest education level: Not on file  Occupational History   Occupation: Conservator, museum/gallery for insurance company  Tobacco Use   Smoking status: Former    Current packs/day: 1.00    Average packs/day: 1 pack/day for 20.0 years (20.0 ttl pk-yrs)    Types: Cigarettes   Smokeless tobacco: Never  Vaping Use   Vaping status: Never Used  Substance and  Sexual Activity   Alcohol use: Yes    Alcohol/week: 2.0 standard drinks of alcohol    Types: 2 Glasses of wine per week   Drug use: No   Sexual activity: Yes    Birth control/protection: Surgical  Other Topics Concern   Not on file  Social History Narrative   Not on file   Social Drivers of Health   Financial Resource Strain: Not on file  Food Insecurity: Not on file  Transportation Needs: Not on file  Physical Activity: Not on file  Stress: Not on file  Social Connections: Not on file  Intimate Partner Violence: Not on file    Physical Exam: Vital signs in last 24 hours: @BP  (!) 149/92   Pulse 82   Temp 98 F (36.7 C)   Ht 5\' 3"  (1.6 m)   Wt 128 lb 6.4 oz (58.2 kg)   SpO2 100%   BMI 22.75 kg/m  GEN: NAD EYE: Sclerae anicteric ENT: MMM CV: Non-tachycardic Pulm: CTA b/l GI: Soft, NT/ND NEURO:  Alert & Oriented x 3   Erick Blinks, MD [facility]  06/07/2023 8:30 AM

## 2023-06-07 NOTE — Progress Notes (Signed)
 Report to PACU, RN, vss, BBS= Clear.

## 2023-06-08 ENCOUNTER — Telehealth: Payer: Self-pay | Admitting: *Deleted

## 2023-06-08 ENCOUNTER — Other Ambulatory Visit: Payer: Self-pay | Admitting: Obstetrics

## 2023-06-08 DIAGNOSIS — M8580 Other specified disorders of bone density and structure, unspecified site: Secondary | ICD-10-CM

## 2023-06-08 LAB — HM MAMMOGRAPHY

## 2023-06-08 NOTE — Telephone Encounter (Signed)
  Follow up Call-     06/07/2023    8:25 AM  Call back number  Post procedure Call Back phone  # [phone]  Permission to leave phone message Yes     Patient questions:  Do you have a fever, pain , or abdominal swelling? No. Pain Score  0 *  Have you tolerated food without any problems? Yes.    Have you been able to return to your normal activities? Yes.    Do you have any questions about your discharge instructions: Diet   No. Medications  No. Follow up visit  No.  Do you have questions or concerns about your Care? No.  Actions: * If pain score is 4 or above: No action needed, pain <4.

## 2023-06-09 LAB — SURGICAL PATHOLOGY

## 2023-06-10 ENCOUNTER — Encounter: Payer: Self-pay | Admitting: Internal Medicine

## 2023-06-11 ENCOUNTER — Telehealth: Payer: Self-pay

## 2023-06-11 ENCOUNTER — Telehealth: Payer: Self-pay | Admitting: Family Medicine

## 2023-06-11 NOTE — Telephone Encounter (Signed)
 Copied from CRM [id number]. Topic: Clinical - Request for Lab/Test Order >> Jun 11, 2023  8:18 AM Nicole [name] wrote: Reason for CRM: Patient called in requesting for labs to be put in for her 3/14 appointment to have blood work done before appointment. Please put lab order in for patient per patients request.

## 2023-06-11 NOTE — Telephone Encounter (Signed)
 Pt wants labs ordered a week before her CPE visit.  Please advice,  Thanks

## 2023-06-11 NOTE — Telephone Encounter (Signed)
 Are you ok w lab orders prior? If so, pt requesting they be placed

## 2023-06-14 ENCOUNTER — Other Ambulatory Visit: Payer: Self-pay | Admitting: Family Medicine

## 2023-06-14 DIAGNOSIS — Z79899 Other long term (current) drug therapy: Secondary | ICD-10-CM

## 2023-06-14 DIAGNOSIS — E785 Hyperlipidemia, unspecified: Secondary | ICD-10-CM

## 2023-06-14 DIAGNOSIS — E538 Deficiency of other specified B group vitamins: Secondary | ICD-10-CM

## 2023-06-14 DIAGNOSIS — Z1329 Encounter for screening for other suspected endocrine disorder: Secondary | ICD-10-CM

## 2023-06-14 DIAGNOSIS — Z0001 Encounter for general adult medical examination with abnormal findings: Secondary | ICD-10-CM

## 2023-06-14 NOTE — Telephone Encounter (Signed)
 Are you ok with labs prior?

## 2023-06-15 NOTE — Telephone Encounter (Signed)
 Called pt and let her know lab orders have been placed and to please come in fasting to get them done. Pt verbalized understanding

## 2023-06-18 ENCOUNTER — Other Ambulatory Visit

## 2023-06-18 DIAGNOSIS — Z0001 Encounter for general adult medical examination with abnormal findings: Secondary | ICD-10-CM

## 2023-06-18 DIAGNOSIS — E538 Deficiency of other specified B group vitamins: Secondary | ICD-10-CM | POA: Diagnosis not present

## 2023-06-18 DIAGNOSIS — E785 Hyperlipidemia, unspecified: Secondary | ICD-10-CM

## 2023-06-18 DIAGNOSIS — Z79899 Other long term (current) drug therapy: Secondary | ICD-10-CM | POA: Diagnosis not present

## 2023-06-18 DIAGNOSIS — Z1329 Encounter for screening for other suspected endocrine disorder: Secondary | ICD-10-CM

## 2023-06-18 LAB — T4, FREE: Free T4: 0.81 ng/dL (ref 0.60–1.60)

## 2023-06-18 LAB — COMPREHENSIVE METABOLIC PANEL
ALT: 11 U/L (ref 0–35)
AST: 19 U/L (ref 0–37)
Albumin: 4.4 g/dL (ref 3.5–5.2)
Alkaline Phosphatase: 61 U/L (ref 39–117)
BUN: 13 mg/dL (ref 6–23)
CO2: 22 meq/L (ref 19–32)
Calcium: 9.6 mg/dL (ref 8.4–10.5)
Chloride: 105 meq/L (ref 96–112)
Creatinine, Ser: 0.9 mg/dL (ref 0.40–1.20)
GFR: 69.97 mL/min (ref >60.00)
Glucose, Bld: 90 mg/dL (ref 70–99)
Potassium: 4.3 meq/L (ref 3.5–5.1)
Sodium: 142 meq/L (ref 135–145)
Total Bilirubin: 0.4 mg/dL (ref 0.2–1.2)
Total Protein: 7.1 g/dL (ref 6.0–8.3)

## 2023-06-18 LAB — CBC WITH DIFFERENTIAL/PLATELET
Basophils Absolute: 0 10*3/uL (ref 0.0–0.1)
Basophils Relative: 0.7 % (ref 0.0–3.0)
Eosinophils Absolute: 0.1 10*3/uL (ref 0.0–0.7)
Eosinophils Relative: 1.8 % (ref 0.0–5.0)
HCT: 44.6 % (ref 36.0–46.0)
Hemoglobin: 15 g/dL (ref 12.0–15.0)
Lymphocytes Relative: 35.9 % (ref 12.0–46.0)
Lymphs Abs: 2.2 10*3/uL (ref 0.7–4.0)
MCHC: 33.7 g/dL (ref 30.0–36.0)
MCV: 95.6 fl (ref 78.0–100.0)
Monocytes Absolute: 0.4 10*3/uL (ref 0.1–1.0)
Monocytes Relative: 6.7 % (ref 3.0–12.0)
Neutro Abs: 3.4 10*3/uL (ref 1.4–7.7)
Neutrophils Relative %: 54.9 % (ref 43.0–77.0)
Platelets: 240 10*3/uL (ref 150.0–400.0)
RBC: 4.66 Mil/uL (ref 3.87–5.11)
RDW: 12.9 % (ref 11.5–15.5)
WBC: 6.1 10*3/uL (ref 4.0–10.5)

## 2023-06-18 LAB — LIPID PANEL
Cholesterol: 223 mg/dL — ABNORMAL HIGH (ref 0–200)
HDL: 68.5 mg/dL (ref >39.00)
LDL Cholesterol: 137 mg/dL — ABNORMAL HIGH (ref 0–99)
NonHDL: 154.23
Total CHOL/HDL Ratio: 3
Triglycerides: 87 mg/dL (ref 0.0–149.0)
VLDL: 17.4 mg/dL (ref 0.0–40.0)

## 2023-06-18 LAB — VITAMIN D 25 HYDROXY (VIT D DEFICIENCY, FRACTURES): VITD: 67.06 ng/mL (ref 30.00–100.00)

## 2023-06-18 LAB — TSH: TSH: 1.78 u[IU]/mL (ref 0.35–5.50)

## 2023-06-18 LAB — VITAMIN B12: Vitamin B-12: >1537 pg/mL — ABNORMAL HIGH (ref 211–911)

## 2023-06-25 ENCOUNTER — Ambulatory Visit (INDEPENDENT_AMBULATORY_CARE_PROVIDER_SITE_OTHER): Payer: 59 | Admitting: Family Medicine

## 2023-06-25 ENCOUNTER — Encounter: Payer: Self-pay | Admitting: Family Medicine

## 2023-06-25 VITALS — BP 122/84 | HR 98 | Temp 97.7°F | Ht 63.0 in | Wt 132.0 lb

## 2023-06-25 DIAGNOSIS — Z0000 Encounter for general adult medical examination without abnormal findings: Secondary | ICD-10-CM | POA: Diagnosis not present

## 2023-06-25 DIAGNOSIS — E7800 Pure hypercholesterolemia, unspecified: Secondary | ICD-10-CM

## 2023-06-25 DIAGNOSIS — Z87891 Personal history of nicotine dependence: Secondary | ICD-10-CM | POA: Diagnosis not present

## 2023-06-25 DIAGNOSIS — Z8249 Family history of ischemic heart disease and other diseases of the circulatory system: Secondary | ICD-10-CM | POA: Diagnosis not present

## 2023-06-25 DIAGNOSIS — Z0001 Encounter for general adult medical examination with abnormal findings: Secondary | ICD-10-CM

## 2023-06-25 NOTE — Progress Notes (Addendum)
 Complete physical exam  Patient: Nicole [name]   DOB: Jun 24, 1965   58 y.o. Female  MRN: [id number]  Subjective:    Chief Complaint  Patient presents with   Annual Exam   She is here for a complete physical exam.  Other providers: OB/GYN- [facility]  Sports medicine- Dr. Katrinka Blazing  GI- Dr. Rhea Belton  Dermatologist -skin check scheduled for July  Neurologist   Former smoker, 20 pack year hx. Stopped 16 years ago.  Quite in 2008  Social history: married, 1 child, no grandchildren, works as Information systems manager- stand up desk   Father MI at age 56 Sister had MI at 37  Mother died from MI    Health Maintenance  Topic Date Due   Screening for Lung Cancer  Never done   COVID-19 Vaccine (2 - Moderna risk series) 07/11/2023*   Flu Shot  07/12/2023*   Pap with HPV screening  04/23/2024   Mammogram  06/07/2025   Colon Cancer Screening  07/24/2026   DTaP/Tdap/Td vaccine (2 - Td or Tdap) 06/22/2032   Hepatitis C Screening  Completed   HIV Screening  Completed   Zoster (Shingles) Vaccine  Completed   HPV Vaccine  Aged Out  *Topic was postponed. The date shown is not the original due date.    Wears seatbelt always, uses sunscreen, smoke detectors in home and functioning, does not text while driving, feels safe in home environment.  Depression screening:    06/25/2023   11:36 AM 06/23/2022    8:23 AM 05/12/2022    8:08 AM  Depression screen PHQ 2/9  Decreased Interest 0 0 0  Down, Depressed, Hopeless 0 0 0  PHQ - 2 Score 0 0 0   Anxiety Screening:     No data to display           Patient Care Team: Avanell Shackleton, NP-C as PCP - General (Family Medicine) [facility] (Obstetrics and Gynecology)   Outpatient Medications Prior to Visit  Medication Sig   cyanocobalamin (VITAMIN B12) 1000 MCG tablet Take 1,000 mcg by mouth daily.   docusate sodium (COLACE) 50 MG capsule Take 50 mg by mouth daily.   estradiol (ESTRACE) 1 MG tablet Take 1 mg by mouth daily.   MAGNESIUM PO  Take 1 capsule by mouth daily.   Multiple Vitamin (MULTIVITAMIN ADULT PO) Take by mouth. With omega 3   progesterone (PROMETRIUM) 100 MG capsule Take 100 mg by mouth at bedtime.   tiZANidine (ZANAFLEX) 4 MG tablet TAKE 1 TABLET BY MOUTH AT BEDTIME.   Vitamin D, Ergocalciferol, (DRISDOL) 1.25 MG (50000 UNIT) CAPS capsule TAKE 1 CAPSULE (50,000 UNITS TOTAL) BY MOUTH EVERY 7 (SEVEN) DAYS   No facility-administered medications prior to visit.    Review of Systems  Constitutional:  Negative for chills, fever, malaise/fatigue and weight loss.  HENT:  Negative for congestion, ear pain, sinus pain and sore throat.   Eyes:  Negative for blurred vision, double vision and pain.  Respiratory:  Negative for cough, shortness of breath and wheezing.   Cardiovascular:  Negative for chest pain, palpitations and leg swelling.  Gastrointestinal:  Negative for abdominal pain, constipation, diarrhea, nausea and vomiting.  Genitourinary:  Negative for dysuria, frequency and urgency.  Musculoskeletal:  Negative for back pain, joint pain and myalgias.  Neurological:  Negative for dizziness, tingling, focal weakness and headaches.  Psychiatric/Behavioral:  Negative for depression. The patient is not nervous/anxious.        Objective:    BP 122/84 (  BP Location: Left Arm, Patient Position: Sitting)   Pulse 98   Temp 97.7 F (36.5 C) (Temporal)   Ht 5\' 3"  (1.6 m)   Wt 132 lb (59.9 kg)   SpO2 100%   BMI 23.38 kg/m  BP Readings from Last 3 Encounters:  06/25/23 122/84  06/07/23 (!) 146/95  04/22/23 110/80   Wt Readings from Last 3 Encounters:  06/25/23 132 lb (59.9 kg)  06/07/23 128 lb 6.4 oz (58.2 kg)  04/22/23 135 lb (61.2 kg)    Physical Exam Constitutional:      General: She is not in acute distress.    Appearance: She is not ill-appearing.  HENT:     Right Ear: Tympanic membrane, ear canal and external ear normal.     Left Ear: Tympanic membrane, ear canal and external ear normal.      Nose: Nose normal.     Mouth/Throat:     Mouth: Mucous membranes are moist.     Pharynx: Oropharynx is clear.  Eyes:     Extraocular Movements: Extraocular movements intact.     Conjunctiva/sclera: Conjunctivae normal.     Pupils: Pupils are equal, round, and reactive to light.  Neck:     Thyroid: No thyroid mass, thyromegaly or thyroid tenderness.  Cardiovascular:     Rate and Rhythm: Normal rate and regular rhythm.     Pulses: Normal pulses.     Heart sounds: Normal heart sounds.  Pulmonary:     Effort: Pulmonary effort is normal.     Breath sounds: Normal breath sounds.  Abdominal:     General: Bowel sounds are normal.     Palpations: Abdomen is soft.     Tenderness: There is no abdominal tenderness. There is no right CVA tenderness, left CVA tenderness, guarding or rebound.  Musculoskeletal:        General: Normal range of motion.     Cervical back: Normal range of motion and neck supple. No tenderness.     Right lower leg: No edema.     Left lower leg: No edema.  Lymphadenopathy:     Cervical: No cervical adenopathy.  Skin:    General: Skin is warm and dry.     Findings: No lesion or rash.  Neurological:     General: No focal deficit present.     Mental Status: She is alert and oriented to person, place, and time.     Cranial Nerves: No cranial nerve deficit.     Sensory: No sensory deficit.     Motor: No weakness.     Gait: Gait normal.  Psychiatric:        Mood and Affect: Mood normal.        Behavior: Behavior normal.        Thought Content: Thought content normal.      No results found for any visits on 06/25/23.    Assessment & Plan:    Routine Health Maintenance and Physical Exam  Problem List Items Addressed This Visit     Former smoker   Hyperlipidemia   Relevant Orders   CT CARDIAC SCORING (SELF PAY ONLY)   EKG 12-Lead   Other Visit Diagnoses       Encounter for general adult medical examination with abnormal findings    -  Primary      Family history of heart disease in female family member before age 60       Relevant Orders   CT CARDIAC SCORING (SELF PAY ONLY)  EKG 12-Lead      Preventive health care reviewed.  Counseling on healthy lifestyle including diet and exercise.  Recommend regular dental and eye exams.  Immunizations reviewed.  Discussed safety. Up-to-date with OB/GYN visit.  Up-to-date with colonoscopy. Elevated LDL.  CT coronary calcium score ordered.  She is aware this is a self-pay test.  She has a strong family history of heart disease and heart attacks at an early age.  Declines cardiology referral for now. EKG shows NSR, rate 77, no acute ST-T wave changes.  10-year ASCVD 2.5%  Follow up pending CT coronary calcium test and in 6 months fasting.   Return in about 6 months (around 12/26/2023) for Fasting follow up.     Hetty Blend, NP-C

## 2023-06-25 NOTE — Patient Instructions (Signed)
 You will receive a call to do the CT coronary calcium test.  I will be in touch with results.  Continue eating a healthy diet and getting at least 150 minutes of physical activity each week.  Follow-up for fasting visit in 6 months please

## 2023-06-28 ENCOUNTER — Ambulatory Visit: Payer: 59 | Admitting: Neurology

## 2023-07-06 ENCOUNTER — Ambulatory Visit (HOSPITAL_COMMUNITY)
Admission: RE | Admit: 2023-07-06 | Discharge: 2023-07-06 | Disposition: A | Discharge status: Home / Self Care | Source: Ambulatory Visit | Attending: Family Medicine | Admitting: Family Medicine

## 2023-07-06 DIAGNOSIS — E7800 Pure hypercholesterolemia, unspecified: Secondary | ICD-10-CM | POA: Insufficient documentation

## 2023-07-06 DIAGNOSIS — Z8249 Family history of ischemic heart disease and other diseases of the circulatory system: Secondary | ICD-10-CM | POA: Insufficient documentation

## 2023-07-08 ENCOUNTER — Encounter: Payer: Self-pay | Admitting: Family Medicine

## 2023-07-12 ENCOUNTER — Other Ambulatory Visit: Payer: Self-pay | Admitting: Family Medicine

## 2023-07-12 DIAGNOSIS — Z8249 Family history of ischemic heart disease and other diseases of the circulatory system: Secondary | ICD-10-CM | POA: Insufficient documentation

## 2023-07-12 DIAGNOSIS — I700 Atherosclerosis of aorta: Secondary | ICD-10-CM | POA: Insufficient documentation

## 2023-07-12 DIAGNOSIS — E7800 Pure hypercholesterolemia, unspecified: Secondary | ICD-10-CM

## 2023-07-26 ENCOUNTER — Ambulatory Visit: Payer: Self-pay | Admitting: General Surgery

## 2023-07-26 NOTE — H&P (Signed)
 REFERRING PHYSICIAN:  Pyrtle, Burnard Leigh, MD  PROVIDER:  Elenora Gamma, MD  MRN: [id number] DOB: Feb 14, 1966 DATE OF ENCOUNTER: 07/26/2023  Subjective   Chief Complaint: New Consultation (AIN-1 foundon flex sig)     History of Present Illness: Nicole [name] is a 58 y.o. female who is seen today as an office consultation at the request of Dr. Rhea Belton for evaluation of New Consultation (AIN-1 foundon flex sig) .   Patient underwent a flexible sigmoidoscopy for follow-up after rubber band ligation.  She was noted to have a couple areas of granular mucosa at the dentate line.  Biopsies were taken.  This showed AIN grade 1.   Review of Systems: A complete review of systems was obtained from the patient.  I have reviewed this information and discussed as appropriate with the patient.  See HPI as well for other ROS.    Medical History: Past Medical History:  Diagnosis Date   Arthritis    Hyperlipidemia     There is no problem list on file for this patient.   Past Surgical History:  Procedure Laterality Date   Breast surgery     LAPAROSCOPIC TUBAL LIGATION       Allergies  Allergen Reactions   Penicillins Hives    Current Outpatient Medications on File Prior to Visit  Medication Sig Dispense Refill   cyanocobalamin (VITAMIN B12) 1000 MCG tablet Take 1,000 mcg by mouth once daily     docusate (COLACE) 50 MG capsule Take 50 mg by mouth once daily     ergocalciferol, vitamin D2, 1,250 mcg (50,000 unit) capsule Take 50,000 Units by mouth every 7 (seven) days     estradioL (ESTRACE) 1 MG tablet Take 1 mg by mouth once daily     MAGNESIUM ORAL Take by mouth     multivit-minerals/folic acid (CENTRUM ADULTS ORAL) Take by mouth     progesterone (PROMETRIUM) 100 MG capsule Take 100 mg by mouth once daily     tiZANidine (ZANAFLEX) 4 MG tablet Take 1 tablet by mouth at bedtime     No current facility-administered medications on file prior to visit.    Family History   Problem Relation Age of Onset   Hyperlipidemia (Elevated cholesterol) Mother    Thyroid disease Mother    Coronary Artery Disease (Blocked arteries around heart) Mother    High blood pressure (Hypertension) Mother    Hyperlipidemia (Elevated cholesterol) Father    High blood pressure (Hypertension) Father    Coronary Artery Disease (Blocked arteries around heart) Father    Obesity Sister    Hyperlipidemia (Elevated cholesterol) Sister    Coronary Artery Disease (Blocked arteries around heart) Sister    High blood pressure (Hypertension) Sister      Social History   Tobacco Use  Smoking Status Former   Types: Cigarettes  Smokeless Tobacco Never     Social History   Socioeconomic History   Marital status: Married  Tobacco Use   Smoking status: Former    Types: Cigarettes   Smokeless tobacco: Never  Vaping Use   Vaping status: Never Used  Substance and Sexual Activity   Alcohol use: Yes   Drug use: Never   Social Drivers of Corporate investment banker Strain: Low Risk  (06/18/2023)   Received from [facility]   Overall Financial Resource Strain (CARDIA)    Difficulty of Paying Living Expenses: Not hard at all  Food Insecurity: No Food Insecurity (06/18/2023)   Received from [facility]  Hunger Vital Sign    Worried About Running Out of Food in the Last Year: Never true    Ran Out of Food in the Last Year: Never true  Transportation Needs: No Transportation Needs (06/18/2023)   Received from [facility] - Transportation    Lack of Transportation (Medical): No    Lack of Transportation (Non-Medical): No  Physical Activity: Sufficiently Active (06/18/2023)   Received from [facility]   Exercise Vital Sign    Days of Exercise per Week: 6 days    Minutes of Exercise per Session: 30 min  Stress: No Stress Concern Present (06/18/2023)   Received from [facility] - Occupational Stress Questionnaire    Feeling of Stress :  Only a little  Social Connections: Moderately Integrated (06/18/2023)   Received from [facility]   Social Connection and Isolation Panel [NHANES]    Frequency of Communication with Friends and Family: More than three times a week    Frequency of Social Gatherings with Friends and Family: Once a week    Attends Religious Services: More than 4 times per year    Active Member of Clubs or Organizations: No    Marital Status: Married  Housing Stability: Unknown (07/26/2023)   Housing Stability Vital Sign    Homeless in the Last Year: No    Objective:    Vitals:   07/26/23 0908  BP: 158/82  Pulse: 90  Temp: 36.7 C (98 F)  SpO2: 99%  Weight: 61.1 kg (134 lb 9.6 oz)  Height: 160 cm (5\' 3" )  PainSc: 0-No pain  PainLoc: Abdomen     Exam Gen: NAD Abd: soft    Labs, Imaging and Diagnostic Testing: Colonoscopy report and images reviewed  Assessment and Plan:  Diagnoses and all orders for this visit:  Rectal bleeding  Anal intraepithelial neoplasia I (AIN I)     58 year old female with anal lesion seen on flexible sigmoidoscopy performed for rectal bleeding and banding recently.  Biopsy show AIN grade 1.  Patient is up-to-date on her cervical screenings.  I have recommended high-resolution anoscopy with possible ablation.  She states that she continues to have rectal bleeding so we will evaluate for any simple hemorrhoid procedures that can be done to help with this while we are there.  We discussed possibly performing a hemorrhoidopexy on her internal hemorrhoid if it appears to be irritated.  If she were to need a complete hemorrhoidectomy, she would like to discuss this further before proceeding.  Fernande Howells, MD Colon and Rectal Surgery [facility]

## 2023-08-12 ENCOUNTER — Other Ambulatory Visit: Payer: Self-pay | Admitting: Family Medicine

## 2023-09-01 ENCOUNTER — Ambulatory Visit: Payer: 59 | Admitting: Internal Medicine

## 2023-09-01 ENCOUNTER — Encounter: Payer: Self-pay | Admitting: Internal Medicine

## 2023-09-01 VITALS — BP 148/90 | HR 76 | Ht 63.0 in | Wt 138.4 lb

## 2023-09-01 DIAGNOSIS — K6282 Dysplasia of anus: Secondary | ICD-10-CM | POA: Diagnosis not present

## 2023-09-01 DIAGNOSIS — K648 Other hemorrhoids: Secondary | ICD-10-CM

## 2023-09-01 DIAGNOSIS — Z860100 Personal history of colon polyps, unspecified: Secondary | ICD-10-CM

## 2023-09-01 NOTE — Patient Instructions (Signed)
 Follow up as needed.   _______________________________________________________  If your blood pressure at your visit was 140/90 or greater, please contact your primary care physician to follow up on this.  _______________________________________________________  If you are age 60 or older, your body mass index should be between 23-30. Your Body mass index is 24.52 kg/m. If this is out of the aforementioned range listed, please consider follow up with your Primary Care Provider.  If you are age 7 or younger, your body mass index should be between 19-25. Your Body mass index is 24.52 kg/m. If this is out of the aformentioned range listed, please consider follow up with your Primary Care Provider.   ________________________________________________________  The [facility] providers would like to encourage you to use MYCHART to communicate with providers for non-urgent requests or questions.  Due to long hold times on the telephone, sending your provider a message by [facility] may be a faster and more efficient way to get a response.  Please allow 48 business hours for a response.  Please remember that this is for non-urgent requests.  _______________________________________________________

## 2023-09-01 NOTE — Progress Notes (Signed)
   Subjective:    Patient ID: Nicole [name], female    DOB: [birth year], 58 y.o.   MRN: [id number]  HPI Nicole [name] is a 58 year old female with a past medical history of symptomatic bleeding internal hemorrhoids, adenomatous colon polyps, diverticulosis, recently discovered AIN with low-grade dysplasia and B12 deficiency who is here for follow-up.  She is here alone today.  She was seen by Dr. Joyce Nixon at [facility] and has scheduled for high-resolution anoscopy with possible ablation and possible hemorrhoidopexy on 09/22/2023.  She has still had some intermittent rectal bleeding painless in nature despite her previous hemorrhoidal banding visits.  No new abdominal pain.   Review of Systems As per HPI, otherwise negative  Current Medications, Allergies, Past Medical History, Past Surgical History, Family History and Social History were reviewed in [employer] record.    Objective:   Physical Exam BP (!) 148/90   Pulse 76   Ht 5\' 3"  (1.6 m)   Wt 138 lb 6.4 oz (62.8 kg)   BMI 24.52 kg/m  Gen: awake, alert, NAD Neuro: nonfocal      Assessment & Plan:  58 year old female with a past medical history of symptomatic bleeding internal hemorrhoids, adenomatous colon polyps, diverticulosis, recently discovered AIN with low-grade dysplasia and B12 deficiency who is here for follow-up.   AIN-1 --we discussed the diagnosis and I completely agree with Dr. Linn Rich plan for high-resolution anoscopy with possible ablation -- Patient will proceed as scheduled on 09/22/2023  2.  Residual bleeding internal hemorrhoid despite rubber band therapy x 3.  I reassured her that I would proceed with hemorrhoidopexy without additional banding given the persistent internal hemorrhoidal bleeding and prolapse. -- Hemorrhoidopexy per Dr. Andy Bannister at the time of her AIN ablation  3.  History of colonic polyps - she is on the appropriate surveillance protocol  She can return as needed to  clinic  20 minutes total spent today including patient facing time, coordination of care, reviewing medical history/procedures/pertinent radiology studies, and documentation of the encounter.

## 2023-09-07 NOTE — Progress Notes (Unsigned)
 [name] [address] [place] [address] Phone: [phone] Subjective:   Nicole [name], am serving as a scribe for Dr. Ronnell Coins.  I'm seeing this patient by the request  of:  Henson, Vickie L, NP-C  CC: Neck pain follow-up  WUX:[id number]  [name] is a 58 y.o. female coming in with complaint of back and neck pain. OMT 12/02/2022. Patient states that the R side of her neck is bothering her more. Also starting to feel pain in back of shoulder.   Medications patient has been prescribed: Zanaflex   Taking:         Reviewed prior external information including notes and imaging from previsou exam, outside providers and external EMR if available.   As well as notes that were available from care everywhere and other healthcare systems.  Past medical history, social, surgical and family history all reviewed in electronic medical record.  No pertanent information unless stated regarding to the chief complaint.   Past Medical History:  Diagnosis Date   Allergy    Seasonal   Arthritis    Nonallopathic lesion of lumbosacral region 06/15/2019    Allergies  Allergen Reactions   Penicillins Hives     Review of Systems:  No headache, visual changes, nausea, vomiting, diarrhea, constipation, dizziness, abdominal pain, skin rash, fevers, chills, night sweats, weight loss, swollen lymph nodes, body aches, joint swelling, chest pain, shortness of breath, mood changes. POSITIVE muscle aches  Objective  Blood pressure (!) 122/92, pulse 94, height 5\' 3"  (1.6 m), weight 139 lb (63 kg), SpO2 97%.   General: No apparent distress alert and oriented x3 mood and affect normal, dressed appropriately.  HEENT: Pupils equal, extraocular movements intact  Respiratory: Patient's speak in full sentences and does not appear short of breath  Cardiovascular: No lower extremity edema, non tender, no erythema  Gait MSK:  Back does have some loss  lordosis noted.  Significant tightness noted in the parascapular area of the right side of the shoulder blade as well as unfortunately the right side of the neck as well.  Osteopathic findings  C2 flexed rotated and side bent right C6 flexed rotated and side bent right sided T3 extended rotated and side bent right inhaled rib L1 flexed rotated and side bent right Sacrum right on right       Assessment and Plan:  Degenerative disc disease, cervical Chronic problem with exacerbation.  All seem to be more right-sided.  Seems to be more of the scapular area.  Multiple small trigger points also noted that we will need to monitor.  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  Patient has been trying to do some lifting on a more regular basis.  Discussed keeping hands within peripheral vision.  No change in medications.  Follow-up again in 6 to 8 weeks otherwise.    Nonallopathic problems  Decision today to treat with OMT was based on Physical Exam  After verbal consent patient was treated with HVLA, ME, FPR techniques in cervical, rib, thoracic, lumbar, and sacral  areas  Patient tolerated the procedure well with improvement in symptoms  Patient given exercises, stretches and lifestyle modifications  See medications in patient instructions if given  Patient will follow up in 4-8 weeks     The above documentation has been reviewed and is accurate and complete [name] M [name], DO         Note:  This dictation was prepared with Dragon dictation along with smaller phrase technology. Any transcriptional errors that result from this process are unintentional.

## 2023-09-08 ENCOUNTER — Ambulatory Visit: Admitting: Family Medicine

## 2023-09-08 ENCOUNTER — Encounter: Payer: Self-pay | Admitting: Family Medicine

## 2023-09-08 VITALS — BP 122/92 | HR 94 | Ht 63.0 in | Wt 139.0 lb

## 2023-09-08 DIAGNOSIS — M9908 Segmental and somatic dysfunction of rib cage: Secondary | ICD-10-CM | POA: Diagnosis not present

## 2023-09-08 DIAGNOSIS — M9901 Segmental and somatic dysfunction of cervical region: Secondary | ICD-10-CM | POA: Diagnosis not present

## 2023-09-08 DIAGNOSIS — M9903 Segmental and somatic dysfunction of lumbar region: Secondary | ICD-10-CM | POA: Diagnosis not present

## 2023-09-08 DIAGNOSIS — M9902 Segmental and somatic dysfunction of thoracic region: Secondary | ICD-10-CM

## 2023-09-08 DIAGNOSIS — M5030 Other cervical disc degeneration, unspecified cervical region: Secondary | ICD-10-CM | POA: Diagnosis not present

## 2023-09-08 DIAGNOSIS — M9904 Segmental and somatic dysfunction of sacral region: Secondary | ICD-10-CM | POA: Diagnosis not present

## 2023-09-08 NOTE — Patient Instructions (Signed)
 Enjoy dance recital See me in 6-8 weeks

## 2023-09-08 NOTE — Patient Instructions (Addendum)
 SURGICAL WAITING ROOM VISITATION Patients having surgery or a procedure may have no more than 2 support people in the waiting area - these visitors may rotate.    Children under the age of 70 must have an adult with them who is not the patient.  If the patient needs to stay at the hospital during part of their recovery, the visitor guidelines for inpatient rooms apply. Pre-op nurse will coordinate an appropriate time for 1 support person to accompany patient in pre-op.  This support person may not rotate.    Please refer to the [facility] website for the visitor guidelines for Inpatients (after your surgery is over and you are in a regular room).       Your procedure is scheduled on: 09-22-23   Report to [facility] Main Entrance    Report to admitting at 6:15 AM   Call this number if you have problems the morning of surgery [phone]   Do not eat food or drink liquids :After Midnight.           If you have questions, please contact your surgeon's office.   FOLLOW BOWEL PREP AND ANY ADDITIONAL PRE OP INSTRUCTIONS YOU RECEIVED FROM YOUR SURGEON'S OFFICE!!!     Oral Hygiene is also important to reduce your risk of infection.                                    Remember - BRUSH YOUR TEETH THE MORNING OF SURGERY WITH YOUR REGULAR TOOTHPASTE   Do NOT smoke after Midnight   Take these medicines the morning of surgery with A SIP OF WATER:  None  Stop all vitamins and herbal supplements 7 days before surgery                            You may not have any metal on your body including hair pins, jewelry, and body piercing             Do not wear make-up, lotions, powders, perfume, or deodorant  Do not wear nail polish including gel and S&S, artificial/acrylic nails, or any other type of covering on natural nails including finger and toenails. If you have artificial nails, gel coating, etc. that needs to be removed by a nail salon please have this removed prior to surgery or  surgery may need to be canceled/ delayed if the surgeon/ anesthesia feels like they are unable to be safely monitored.   Do not shave  48 hours prior to surgery.    Do not bring valuables to the hospital. [facility] [place] IS NOT RESPONSIBLE   FOR VALUABLES.   Contacts, dentures or bridgework may not be worn into surgery.  DO NOT BRING YOUR HOME MEDICATIONS TO THE HOSPITAL. PHARMACY WILL DISPENSE MEDICATIONS LISTED ON YOUR MEDICATION LIST TO YOU DURING YOUR ADMISSION IN THE HOSPITAL!    Patients discharged on the day of surgery will not be allowed to drive home.  Someone NEEDS to stay with you for the first 24 hours after anesthesia.              Please read over the following fact sheets you were given: IF YOU HAVE QUESTIONS ABOUT YOUR PRE-OP INSTRUCTIONS PLEASE CALL [phone] Gwen  If you received a COVID test during your pre-op visit  it is requested that you wear a mask when out in  public, stay away from anyone that may not be feeling well and notify your surgeon if you develop symptoms. If you test positive for Covid or have been in contact with anyone that has tested positive in the last 10 days please notify you surgeon.  [place] - Preparing for Surgery Before surgery, you can play an important role.  Because skin is not sterile, your skin needs to be as free of germs as possible.  You can reduce the number of germs on your skin by washing with CHG (chlorahexidine gluconate) soap before surgery.  CHG is an antiseptic cleaner which kills germs and bonds with the skin to continue killing germs even after washing. Please DO NOT use if you have an allergy to CHG or antibacterial soaps.  If your skin becomes reddened/irritated stop using the CHG and inform your nurse when you arrive at Short Stay. Do not shave (including legs and underarms) for at least 48 hours prior to the first CHG shower.  You may shave your face/neck.  Please follow these instructions carefully:  1.  Shower with CHG  Soap the night before surgery and the  morning of surgery.  2.  If you choose to wash your hair, wash your hair first as usual with your normal  shampoo.  3.  After you shampoo, rinse your hair and body thoroughly to remove the shampoo.                             4.  Use CHG as you would any other liquid soap.  You can apply chg directly to the skin and wash.  Gently with a scrungie or clean washcloth.  5.  Apply the CHG Soap to your body ONLY FROM THE NECK DOWN.   Do   not use on face/ open                           Wound or open sores. Avoid contact with eyes, ears mouth and   genitals (private parts).                       Wash face,  Genitals (private parts) with your normal soap.             6.  Wash thoroughly, paying special attention to the area where your    surgery  will be performed.  7.  Thoroughly rinse your body with warm water from the neck down.  8.  DO NOT shower/wash with your normal soap after using and rinsing off the CHG Soap.                9.  Pat yourself dry with a clean towel.            10.  Wear clean pajamas.            11.  Place clean sheets on your bed the night of your first shower and do not  sleep with pets. Day of Surgery : Do not apply any lotions/deodorants the morning of surgery.  Please wear clean clothes to the hospital/surgery center.  FAILURE TO FOLLOW THESE INSTRUCTIONS MAY RESULT IN THE CANCELLATION OF YOUR SURGERY  PATIENT SIGNATURE_________________________________  NURSE SIGNATURE__________________________________  ________________________________________________________________________

## 2023-09-08 NOTE — Assessment & Plan Note (Signed)
 Chronic problem with exacerbation.  All seem to be more right-sided.  Seems to be more of the scapular area.  Multiple small trigger points also noted that we will need to monitor.  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  Patient has been trying to do some lifting on a more regular basis.  Discussed keeping hands within peripheral vision.  No change in medications.  Follow-up again in 6 to 8 weeks otherwise.

## 2023-09-13 NOTE — Progress Notes (Addendum)
 COVID Vaccine Completed:  Yes  Date of COVID positive in last 90 days:  No  PCP - Alyson Back, NP-C Cardiologist - Eilleen Grates, MD (appt in July for high cholesterol)  Chest x-ray - N/A EKG - 06-25-23 Epic Stress Test - N/A ECHO - N/A Cardiac Cath - N/A Pacemaker/ICD device last checked: Spinal Cord Stimulator:N/A Cardiac CT - 07-06-23 Epic  Bowel Prep - N/A  Sleep Study - N/A CPAP -   Fasting Blood Sugar - N/A Checks Blood Sugar _____ times a day  Last dose of GLP1 agonist-  N/A GLP1 instructions:  Hold 7 days before surgery    Last dose of SGLT-2 inhibitors-  N/A SGLT-2 instructions:  Hold 3 days before surgery   Blood Thinner Instructions: N/A Aspirin Instructions: Last Dose:  Activity level:  Can go up a flight of stairs and perform activities of daily living without stopping and without symptoms of chest pain or shortness of breath. Able to exercise without symptoms  Anesthesia review: BP elevated at PAT 173/105 and on recheck 163/95 with no diagnosis of HTN.  Pt states that she has white coat HTN.  Denies any associated symptoms of chest pain, headache or SOB.  Patient denies shortness of breath, fever, cough and chest pain at PAT appointment  Patient verbalized understanding of instructions that were given to them at the PAT appointment. Patient was also instructed that they will need to review over the PAT instructions again at home before surgery.

## 2023-09-14 ENCOUNTER — Encounter (HOSPITAL_COMMUNITY)
Admission: RE | Admit: 2023-09-14 | Discharge: 2023-09-14 | Disposition: A | Discharge status: Home / Self Care | Source: Ambulatory Visit | Attending: General Surgery | Admitting: General Surgery

## 2023-09-14 ENCOUNTER — Encounter (HOSPITAL_COMMUNITY): Payer: Self-pay | Admitting: *Deleted

## 2023-09-14 ENCOUNTER — Other Ambulatory Visit: Payer: Self-pay

## 2023-09-14 DIAGNOSIS — Z01818 Encounter for other preprocedural examination: Secondary | ICD-10-CM | POA: Insufficient documentation

## 2023-09-22 ENCOUNTER — Encounter (HOSPITAL_COMMUNITY)
Admission: RE | Disposition: A | Discharge status: Home / Self Care | Payer: Self-pay | Source: Home / Self Care | Attending: General Surgery

## 2023-09-22 ENCOUNTER — Ambulatory Visit (HOSPITAL_COMMUNITY)
Admission: RE | Admit: 2023-09-22 | Discharge: 2023-09-22 | Disposition: A | Discharge status: Home / Self Care | Attending: General Surgery | Admitting: General Surgery

## 2023-09-22 ENCOUNTER — Ambulatory Visit (HOSPITAL_COMMUNITY): Payer: Self-pay | Admitting: Physician Assistant

## 2023-09-22 ENCOUNTER — Encounter (HOSPITAL_COMMUNITY): Payer: Self-pay | Admitting: General Surgery

## 2023-09-22 ENCOUNTER — Other Ambulatory Visit: Payer: Self-pay

## 2023-09-22 ENCOUNTER — Ambulatory Visit (HOSPITAL_BASED_OUTPATIENT_CLINIC_OR_DEPARTMENT_OTHER): Admitting: Anesthesiology

## 2023-09-22 DIAGNOSIS — K625 Hemorrhage of anus and rectum: Secondary | ICD-10-CM | POA: Insufficient documentation

## 2023-09-22 DIAGNOSIS — K641 Second degree hemorrhoids: Secondary | ICD-10-CM | POA: Diagnosis not present

## 2023-09-22 DIAGNOSIS — K6282 Dysplasia of anus: Secondary | ICD-10-CM | POA: Insufficient documentation

## 2023-09-22 DIAGNOSIS — Z87891 Personal history of nicotine dependence: Secondary | ICD-10-CM | POA: Diagnosis not present

## 2023-09-22 HISTORY — PX: RECTAL BIOPSY: SHX2303

## 2023-09-22 HISTORY — PX: HIGH RESOLUTION ANOSCOPY: SHX6345

## 2023-09-22 SURGERY — ANOSCOPY, HIGH RESOLUTION
Anesthesia: Monitor Anesthesia Care | Site: Rectum

## 2023-09-22 MED ORDER — GLYCOPYRROLATE 0.2 MG/ML IJ SOLN
INTRAMUSCULAR | Status: DC | PRN
  Administered 2023-09-22: .2 mg via INTRAVENOUS

## 2023-09-22 MED ORDER — ORAL CARE MOUTH RINSE: 15.0000 mL | Freq: Once | OROMUCOSAL | Status: AC

## 2023-09-22 MED ORDER — OXYCODONE HCL 5 MG PO TABS: 5.0000 mg | ORAL_TABLET | ORAL | Status: DC | PRN

## 2023-09-22 MED ORDER — BUPIVACAINE LIPOSOME 1.3 % IJ SUSP
INTRAMUSCULAR | Status: AC
Start: 2023-09-22 — End: 2023-09-22
  Filled 2023-09-22: qty 20

## 2023-09-22 MED ORDER — KETAMINE HCL 50 MG/5ML IJ SOSY
PREFILLED_SYRINGE | INTRAMUSCULAR | Status: AC
  Filled 2023-09-22: qty 5

## 2023-09-22 MED ORDER — ACETAMINOPHEN 325 MG PO TABS: 650.0000 mg | ORAL_TABLET | ORAL | Status: DC | PRN

## 2023-09-22 MED ORDER — SODIUM CHLORIDE 0.9% FLUSH: 3.0000 mL | INTRAVENOUS | Status: DC | PRN

## 2023-09-22 MED ORDER — LACTATED RINGERS IV SOLN: INTRAVENOUS | Status: DC

## 2023-09-22 MED ORDER — ACETAMINOPHEN 500 MG PO TABS
1000.0000 mg | ORAL_TABLET | ORAL | Status: AC
  Administered 2023-09-22: 1000 mg via ORAL
  Filled 2023-09-22: qty 2

## 2023-09-22 MED ORDER — LIDOCAINE HCL (CARDIAC) PF 100 MG/5ML IV SOSY
PREFILLED_SYRINGE | INTRAVENOUS | Status: DC | PRN
  Administered 2023-09-22: 100 mg via INTRATRACHEAL

## 2023-09-22 MED ORDER — BUPIVACAINE-EPINEPHRINE (PF) 0.5% -1:200000 IJ SOLN
INTRAMUSCULAR | Status: AC
Start: 2023-09-22 — End: 2023-09-22
  Filled 2023-09-22: qty 30

## 2023-09-22 MED ORDER — SODIUM CHLORIDE 0.9 % IV SOLN: 250.0000 mL | INTRAVENOUS | Status: DC | PRN

## 2023-09-22 MED ORDER — DROPERIDOL 2.5 MG/ML IJ SOLN: 0.6250 mg | Freq: Once | INTRAMUSCULAR | Status: DC | PRN

## 2023-09-22 MED ORDER — TRAMADOL HCL 50 MG PO TABS: 50.0000 mg | ORAL_TABLET | Freq: Four times a day (QID) | ORAL | 0 refills | Status: DC | PRN

## 2023-09-22 MED ORDER — LIDOCAINE HCL (PF) 2 % IJ SOLN
INTRAMUSCULAR | Status: AC
  Filled 2023-09-22: qty 5

## 2023-09-22 MED ORDER — MIDAZOLAM HCL 2 MG/2ML IJ SOLN
INTRAMUSCULAR | Status: AC
Start: 2023-09-22 — End: 2023-09-22
  Filled 2023-09-22: qty 2

## 2023-09-22 MED ORDER — GLYCOPYRROLATE 0.2 MG/ML IJ SOLN
INTRAMUSCULAR | Status: AC
  Filled 2023-09-22: qty 1

## 2023-09-22 MED ORDER — BUPIVACAINE-EPINEPHRINE 0.5% -1:200000 IJ SOLN
INTRAMUSCULAR | Status: DC | PRN
  Administered 2023-09-22: 30 mL

## 2023-09-22 MED ORDER — ACETIC ACID 5 % SOLN
Status: DC | PRN
  Administered 2023-09-22: 1 via TOPICAL

## 2023-09-22 MED ORDER — PHENYLEPHRINE 80 MCG/ML (10ML) SYRINGE FOR IV PUSH (FOR BLOOD PRESSURE SUPPORT)
PREFILLED_SYRINGE | INTRAVENOUS | Status: DC | PRN
  Administered 2023-09-22: 80 ug via INTRAVENOUS

## 2023-09-22 MED ORDER — CHLORHEXIDINE GLUCONATE 0.12 % MT SOLN
15.0000 mL | Freq: Once | OROMUCOSAL | Status: AC
Start: 2023-09-22 — End: 2023-09-22
  Administered 2023-09-22: 15 mL via OROMUCOSAL

## 2023-09-22 MED ORDER — SODIUM CHLORIDE 0.9% FLUSH: 3.0000 mL | Freq: Two times a day (BID) | INTRAVENOUS | Status: DC

## 2023-09-22 MED ORDER — MIDAZOLAM HCL 2 MG/2ML IJ SOLN
INTRAMUSCULAR | Status: DC | PRN
  Administered 2023-09-22: 2 mg via INTRAVENOUS

## 2023-09-22 MED ORDER — PROPOFOL 500 MG/50ML IV EMUL
INTRAVENOUS | Status: AC
  Filled 2023-09-22: qty 50

## 2023-09-22 MED ORDER — ONDANSETRON HCL 4 MG/2ML IJ SOLN
INTRAMUSCULAR | Status: DC | PRN
  Administered 2023-09-22: 4 mg via INTRAVENOUS

## 2023-09-22 MED ORDER — FENTANYL CITRATE (PF) 100 MCG/2ML IJ SOLN
INTRAMUSCULAR | Status: AC
Start: 2023-09-22 — End: 2023-09-22
  Filled 2023-09-22: qty 2

## 2023-09-22 MED ORDER — DEXAMETHASONE SODIUM PHOSPHATE 10 MG/ML IJ SOLN
INTRAMUSCULAR | Status: AC
  Filled 2023-09-22: qty 1

## 2023-09-22 MED ORDER — CELECOXIB 200 MG PO CAPS
200.0000 mg | ORAL_CAPSULE | ORAL | Status: AC
  Administered 2023-09-22: 200 mg via ORAL
  Filled 2023-09-22: qty 1

## 2023-09-22 MED ORDER — ACETIC ACID 5 % SOLN
Status: AC
Start: 2023-09-22 — End: 2023-09-22
  Filled 2023-09-22: qty 24

## 2023-09-22 MED ORDER — ONDANSETRON HCL 4 MG/2ML IJ SOLN
INTRAMUSCULAR | Status: AC
  Filled 2023-09-22: qty 2

## 2023-09-22 MED ORDER — FENTANYL CITRATE (PF) 100 MCG/2ML IJ SOLN
INTRAMUSCULAR | Status: DC | PRN
  Administered 2023-09-22 (×2): 25 ug via INTRAVENOUS
  Administered 2023-09-22: 50 ug via INTRAVENOUS

## 2023-09-22 MED ORDER — HYDROMORPHONE HCL 1 MG/ML IJ SOLN: 0.2500 mg | INTRAMUSCULAR | Status: DC | PRN

## 2023-09-22 MED ORDER — LACTATED RINGERS IV SOLN
INTRAVENOUS | Status: DC | PRN
Start: 2023-09-22 — End: 2023-09-22

## 2023-09-22 MED ORDER — PROPOFOL 10 MG/ML IV BOLUS
INTRAVENOUS | Status: DC | PRN
  Administered 2023-09-22: 80 ug/kg/min via INTRAVENOUS
  Administered 2023-09-22 (×2): 50 mg via INTRAVENOUS

## 2023-09-22 MED ORDER — ACETIC ACID 5 % SOLN
Status: AC
  Filled 2023-09-22: qty 12

## 2023-09-22 MED ORDER — GABAPENTIN 300 MG PO CAPS
300.0000 mg | ORAL_CAPSULE | ORAL | Status: AC
  Administered 2023-09-22: 300 mg via ORAL
  Filled 2023-09-22: qty 1

## 2023-09-22 MED ORDER — DEXAMETHASONE SODIUM PHOSPHATE 10 MG/ML IJ SOLN
INTRAMUSCULAR | Status: DC | PRN
  Administered 2023-09-22: 8 mg via INTRAVENOUS

## 2023-09-22 MED ORDER — ACETAMINOPHEN 650 MG RE SUPP: 650.0000 mg | RECTAL | Status: DC | PRN

## 2023-09-22 SURGICAL SUPPLY — 33 items
APPLICATOR COTTON TIP 6 STRL (MISCELLANEOUS) IMPLANT
APPLICATOR COTTON TIP 6IN STRL (MISCELLANEOUS) IMPLANT
BENZOIN TINCTURE PRP APPL 2/3 (GAUZE/BANDAGES/DRESSINGS) ×2 IMPLANT
BLADE EXTENDED COATED 6.5IN (ELECTRODE) IMPLANT
BLADE SURG SZ10 CARB STEEL (BLADE) ×2 IMPLANT
BRIEF MESH DISP LRG (UNDERPADS AND DIAPERS) ×2 IMPLANT
COVER BACK TABLE 60X90IN (DRAPES) ×2 IMPLANT
DRAPE LAPAROTOMY T 98X78 PEDS (DRAPES) ×2 IMPLANT
DRAPE UTILITY XL STRL (DRAPES) ×2 IMPLANT
ELECTRODE REM PT RTRN 9FT ADLT (ELECTROSURGICAL) ×2 IMPLANT
GAUZE 4X4 16PLY ~~LOC~~+RFID DBL (SPONGE) ×2 IMPLANT
GAUZE PAD ABD 8X10 STRL (GAUZE/BANDAGES/DRESSINGS) IMPLANT
GAUZE SPONGE 4X4 12PLY STRL (GAUZE/BANDAGES/DRESSINGS) IMPLANT
GLOVE BIO SURGEON STRL SZ 6.5 (GLOVE) ×2 IMPLANT
GLOVE INDICATOR 6.5 STRL GRN (GLOVE) ×2 IMPLANT
KIT BASIN OR (CUSTOM PROCEDURE TRAY) ×2 IMPLANT
KIT SIGMOIDOSCOPE (SET/KITS/TRAYS/PACK) IMPLANT
KIT TURNOVER CYSTO (KITS) ×2 IMPLANT
NDL HYPO 22X1.5 SAFETY MO (MISCELLANEOUS) ×2 IMPLANT
NEEDLE HYPO 22X1.5 SAFETY MO (MISCELLANEOUS) ×1 IMPLANT
NS IRRIG 500ML POUR BTL (IV SOLUTION) ×2 IMPLANT
PAD ARMBOARD POSITIONER FOAM (MISCELLANEOUS) IMPLANT
PENCIL SMOKE EVACUATOR (MISCELLANEOUS) ×2 IMPLANT
SPIKE FLUID TRANSFER (MISCELLANEOUS) ×2 IMPLANT
SPONGE SURGIFOAM ABS GEL 12-7 (HEMOSTASIS) IMPLANT
SUT CHROMIC 2 0 SH (SUTURE) IMPLANT
SUT CHROMIC 3 0 SH 27 (SUTURE) IMPLANT
SUT VIC AB 2-0 SH 27X BRD (SUTURE) IMPLANT
SYR BULB IRRIG 60ML STRL (SYRINGE) ×2 IMPLANT
SYR CONTROL 10ML LL (SYRINGE) ×2 IMPLANT
TOWEL OR 17X24 6PK STRL BLUE (TOWEL DISPOSABLE) ×2 IMPLANT
TUBING CONNECTING 10 (TUBING) ×2 IMPLANT
YANKAUER SUCT BULB TIP NO VENT (SUCTIONS) IMPLANT

## 2023-09-22 NOTE — Anesthesia Preprocedure Evaluation (Addendum)
 Anesthesia Evaluation  Patient identified by MRN, date of birth, ID band Patient awake    Reviewed: Allergy & Precautions, NPO status , Patient's Chart, lab work & pertinent test results  Airway Mallampati: II       Dental no notable dental hx.    Pulmonary former smoker   Pulmonary exam normal        Cardiovascular negative cardio ROS  Rhythm:Regular Rate:Normal     Neuro/Psych negative neurological ROS  negative psych ROS   GI/Hepatic Neg liver ROS,,,Rectal bleeding   Endo/Other  negative endocrine ROS    Renal/GU negative Renal ROS  negative genitourinary   Musculoskeletal  (+) Arthritis , Osteoarthritis,    Abdominal Normal abdominal exam  (+)   Peds  Hematology Lab Results      Component                Value               Date                      WBC                      6.1                 06/18/2023                HGB                      15.0                06/18/2023                HCT                      44.6                06/18/2023                MCV                      95.6                06/18/2023                PLT                      240.0               06/18/2023             Lab Results      Component                Value               Date                      NA                       142                 06/18/2023                K                        4.3  06/18/2023                CO2                      22                  06/18/2023                GLUCOSE                  90                  06/18/2023                BUN                      13                  06/18/2023                CREATININE               0.90                06/18/2023                CALCIUM                  9.6                 06/18/2023                GFR                      69.97               06/18/2023              Anesthesia Other Findings   Reproductive/Obstetrics                               Anesthesia Physical Anesthesia Plan  ASA: 2  Anesthesia Plan: MAC   Post-op Pain Management: Tylenol PO (pre-op)*, Celebrex PO (pre-op)* and Gabapentin  PO (pre-op)*   Induction: Intravenous  PONV Risk Score and Plan: 2 and Ondansetron, Dexamethasone, Midazolam, Treatment may vary due to age or medical condition and Propofol infusion  Airway Management Planned: Simple Face Mask and Nasal Cannula  Additional Equipment: None  Intra-op Plan:   Post-operative Plan:   Informed Consent: I have reviewed the patients History and Physical, chart, labs and discussed the procedure including the risks, benefits and alternatives for the proposed anesthesia with the patient or authorized representative who has indicated his/her understanding and acceptance.     Dental advisory given  Plan Discussed with: CRNA  Anesthesia Plan Comments:         Anesthesia Quick Evaluation

## 2023-09-22 NOTE — Discharge Instructions (Addendum)

## 2023-09-22 NOTE — H&P (Signed)
 REFERRING PHYSICIAN:  Pyrtle, Flynn Hylan, MD   PROVIDER:  Denese Finn, MD   MRN: [id number] DOB: 11/06/1965    Subjective    Chief Complaint: New Consultation (AIN-1 foundon flex sig)       History of Present Illness: Ruthe Roemer is a 58 y.o. female who is seen today as an office consultation at the request of Dr. Bridgett Camps for evaluation of New Consultation (AIN-1 foundon flex sig) .   Patient underwent a flexible sigmoidoscopy for follow-up after rubber band ligation.  She was noted to have a couple areas of granular mucosa at the dentate line.  Biopsies were taken.  This showed AIN grade 1.     Review of Systems: A complete review of systems was obtained from the patient.  I have reviewed this information and discussed as appropriate with the patient.  See HPI as well for other ROS.       Medical History:     Past Medical History:  Diagnosis Date   Arthritis     Hyperlipidemia        There is no problem list on file for this patient.          Past Surgical History:  Procedure Laterality Date   Breast surgery       LAPAROSCOPIC TUBAL LIGATION              Allergies  Allergen Reactions   Penicillins Hives            Current Outpatient Medications on File Prior to Visit  Medication Sig Dispense Refill   cyanocobalamin  (VITAMIN B12) 1000 MCG tablet Take 1,000 mcg by mouth once daily       docusate (COLACE) 50 MG capsule Take 50 mg by mouth once daily       ergocalciferol , vitamin D2, 1,250 mcg (50,000 unit) capsule Take 50,000 Units by mouth every 7 (seven) days       estradioL (ESTRACE) 1 MG tablet Take 1 mg by mouth once daily       MAGNESIUM ORAL Take by mouth       multivit-minerals/folic acid (CENTRUM ADULTS ORAL) Take by mouth       progesterone (PROMETRIUM) 100 MG capsule Take 100 mg by mouth once daily       tiZANidine  (ZANAFLEX ) 4 MG tablet Take 1 tablet by mouth at bedtime        No current facility-administered medications on file prior to  visit.           Family History  Problem Relation Age of Onset   Hyperlipidemia (Elevated cholesterol) Mother     Thyroid disease Mother     Coronary Artery Disease (Blocked arteries around heart) Mother     High blood pressure (Hypertension) Mother     Hyperlipidemia (Elevated cholesterol) Father     High blood pressure (Hypertension) Father     Coronary Artery Disease (Blocked arteries around heart) Father     Obesity Sister     Hyperlipidemia (Elevated cholesterol) Sister     Coronary Artery Disease (Blocked arteries around heart) Sister     High blood pressure (Hypertension) Sister        Social History        Tobacco Use  Smoking Status Former   Types: Cigarettes  Smokeless Tobacco Never      Social History         Socioeconomic History   Marital status: Married  Tobacco Use   Smoking status: Former  Types: Cigarettes   Smokeless tobacco: Never  Vaping Use   Vaping status: Never Used  Substance and Sexual Activity   Alcohol use: Yes   Drug use: Never    Social Drivers of Acupuncturist Strain: Low Risk  (06/18/2023)    Received from [facility]    Overall Financial Resource Strain (CARDIA)     Difficulty of Paying Living Expenses: Not hard at all  Food Insecurity: No Food Insecurity (06/18/2023)    Received from [facility]    Hunger Vital Sign     Worried About Running Out of Food in the Last Year: Never true     Ran Out of Food in the Last Year: Never true  Transportation Needs: No Transportation Needs (06/18/2023)    Received [facility]     Lack of Transportation (Medical): No     Lack of Transportation (Non-Medical): No  Physical Activity: Sufficiently Active (06/18/2023)    Received from [facility]    Exercise Vital Sign     Days of Exercise per Week: 6 days     Minutes of Exercise per Session: 30 min  Stress: No Stress Concern Present (06/18/2023)    Received from [facility] - Occupational Stress Questionnaire     Feeling of Stress : Only a little  Social Connections: Moderately Integrated (06/18/2023)    Received from [facility]    Social Connection and Isolation Panel [NHANES]     Frequency of Communication with Friends and Family: More than three times a week     Frequency of Social Gatherings with Friends and Family: Once a week     Attends Religious Services: More than 4 times per year     Active Member of Clubs or Organizations: No     Marital Status: Married  Housing Stability: Unknown (07/26/2023)    Housing Stability Vital Sign     Homeless in the Last Year: No      Objective:      Vitals:   09/22/23 0633  BP: (!) 180/105  Pulse: 77  Resp: 16  Temp: 98.1 F (36.7 C)  SpO2: 99%      Exam Gen: NAD CV: RRR Pulm:CTA Abd: soft       Labs, Imaging and Diagnostic Testing: Colonoscopy report and images reviewed   Assessment and Plan:  Diagnoses and all orders for this visit:   Rectal bleeding   Anal intraepithelial neoplasia I (AIN I)       59 year old female with anal lesion seen on flexible sigmoidoscopy performed for rectal bleeding and banding recently.  Biopsy show AIN grade 1.  Patient is up-to-date on her cervical screenings.  I have recommended high-resolution anoscopy with possible ablation.  She states that she continues to have rectal bleeding so we will evaluate for any simple hemorrhoid procedures that can be done to help with this while we are there.  We discussed possibly performing a hemorrhoidopexy on her internal hemorrhoid if it appears to be irritated.  If she were to need a complete hemorrhoidectomy, she would like to discuss this further before proceeding.   Fernande Howells, MD Colon and Rectal Surgery [facility]

## 2023-09-22 NOTE — Anesthesia Procedure Notes (Signed)
 Procedure Name: MAC Date/Time: 09/22/2023 8:50 AM  Performed by: Darlena Ego, CRNAPre-anesthesia Checklist: Patient identified, Emergency Drugs available, Suction available, Patient being monitored and Timeout performed Patient Re-evaluated:Patient Re-evaluated prior to induction Oxygen Delivery Method: Simple face mask Preoxygenation: Pre-oxygenation with 100% oxygen Induction Type: IV induction Airway Equipment and Method: Oral airway

## 2023-09-22 NOTE — Anesthesia Postprocedure Evaluation (Signed)
 Anesthesia Post Note  Patient: Nicole [name]  Procedure(s) Performed: ANOSCOPY, HIGH RESOLUTION (Rectum) BIOPSY, RECTUM, HEMORRHOIDOPEXY (Rectum)     Patient location during evaluation: PACU Anesthesia Type: MAC Level of consciousness: awake and alert Pain management: pain level controlled Vital Signs Assessment: post-procedure vital signs reviewed and stable Respiratory status: spontaneous breathing, nonlabored ventilation, respiratory function stable and patient connected to nasal cannula oxygen Cardiovascular status: stable and blood pressure returned to baseline Postop Assessment: no apparent nausea or vomiting Anesthetic complications: no   No notable events documented.  Last Vitals:  Vitals:   09/22/23 1015 09/22/23 1019  BP:  (!) 161/100  Pulse: 96 94  Resp: 18   Temp: 36.4 C   SpO2:  96%    Last Pain:  Vitals:   09/22/23 1019  TempSrc:   PainSc: 0-No pain                 Theotis Flake P Sherhonda Gaspar

## 2023-09-22 NOTE — Transfer of Care (Signed)
 Immediate Anesthesia Transfer of Care Note  Patient: Nicole [name]  Procedure(s) Performed: ANOSCOPY, HIGH RESOLUTION (Rectum) BIOPSY, RECTUM, HEMORRHOIDOPEXY (Rectum)  Patient Location: PACU  Anesthesia Type:MAC  Level of Consciousness: awake, alert , and oriented  Airway & Oxygen Therapy: Patient Spontanous Breathing and Patient connected to face mask oxygen  Post-op Assessment: Report given to RN and Post -op Vital signs reviewed and stable  Post vital signs: Reviewed and stable  Last Vitals:  Vitals Value Taken Time  BP    Temp    Pulse    Resp    SpO2      Last Pain:  Vitals:   09/22/23 0633  TempSrc: Oral         Complications: No notable events documented.

## 2023-09-22 NOTE — Op Note (Signed)
 09/22/2023  9:12 AM  PATIENT:  Noemi Batter  58 y.o. female  Patient Care Team: Abram Abraham, NP-C as PCP - General (Family Medicine) [facility] (Obstetrics and Gynecology)  PRE-OPERATIVE DIAGNOSIS:  ANAL INTRAEPITHELIAL NEOPLASIA RECTAL BLEEDING  POST-OPERATIVE DIAGNOSIS:  ANAL INTRAEPITHELIAL NEOPLASIA, RECTAL BLEEDING  PROCEDURE:  ANOSCOPY, HIGH RESOLUTION BIOPSY, RECTUM  HEMORRHOIDOPEXY    Surgeon(s): Joyce Nixon, MD  ASSISTANT: none   ANESTHESIA:   local and MAC  SPECIMEN:  Source of Specimen:  R lateral anal canal, L posterior anal canal  DISPOSITION OF SPECIMEN:  PATHOLOGY  COUNTS:  YES  PLAN OF CARE: Discharge to home after PACU  PATIENT DISPOSITION:  PACU - hemodynamically stable.  INDICATION: 58 y.o. 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  MAC anesthesia was induced without difficulty. The patient was then positioned in prone jackknife position with buttocks gently taped apart.  The patient was then prepped and draped in usual sterile fashion.  SCDs were noted to be in place prior to the initiation of anesthesia. A surgical timeout was performed indicating the correct patient, procedure, positioning and need for preoperative antibiotics.  A rectal block was performed using Marcaine with epinephrine.    I began with a digital rectal exam.  The anal canal was gently dilated.  I then placed a Hill-Ferguson anoscope into the anal canal and evaluated this completely.  The patient had a chronically prolapsing R posterior hemorrhoid that appeared to causing some chronic inflammation.   The perianal region was evaluated with a colposcope.  There were no external lesions present.  The internal anal  canal was evaluated via anoscopy with a Hill-Ferguson anoscope.  There was diffuse staining around her prolapsing hemorrhoid from R lateral, posterior midline and the L posterior anal canal.  A representative biopsy was taken of the R lateral and L posterior anal canal.  After this was completed, hemostasis was achieved with electrocautery.  I then used a 2-0 Vicryl suture to perform a hemorrhoidopexy of the right posterior prolapsing hemorrhoid.  The anal canal was inspected.  There was no active bleeding.  A dressing was applied.  The patient was awakened from anesthesia and sent to the postanesthesia care unit in stable condition. All counts were correct operating room staff.   Fernande Howells, MD  Colorectal and General Surgery [facility]

## 2023-09-23 ENCOUNTER — Encounter (HOSPITAL_COMMUNITY): Payer: Self-pay | Admitting: General Surgery

## 2023-09-23 LAB — SURGICAL PATHOLOGY

## 2023-10-12 NOTE — Progress Notes (Unsigned)
 Cardiology Office Note   Date:  10/13/2023   ID:  Paxton Kanaan, DOB 02/17/1966, MRN [id number]  PCP:  Lendia Boby CROME, NP-C  Cardiologist:   Lynwood Schilling, MD Referring:  Lendia Boby CROME, NP-C  Chief Complaint  Patient presents with   Elevated coronary calcium      History of Present Illness: Nicole [name] is a 58 y.o. female who presents for evaluation of a family history of early CAD and a personal history aortic atherosclerosis.  She was referred by Lendia Boby CROME, NP-C.    She did have a calcium score that was 68th percentile.  However it was only 3.     She has no past cardiac history otherwise.  She is physically active and exercises almost every day.  She gets her heart rate up. The patient denies any new symptoms such as chest discomfort, neck or arm discomfort. There has been no new shortness of breath, PND or orthopnea. There has been no presyncope or syncope.  She has some rare fleeting palpitations.  She has not had prior cardiac workup.  She eats well.       Past Medical History:  Diagnosis Date   Allergy    Seasonal   Arthritis    Nonallopathic lesion of lumbosacral region 06/15/2019    Past Surgical History:  Procedure Laterality Date   ablasion     BREAST SURGERY  25years ago   COLONOSCOPY  ? 2012   Dr.Mann-polyps-recall 5 years per pt   HIGH RESOLUTION ANOSCOPY N/A 09/22/2023   Procedure: ANOSCOPY, HIGH RESOLUTION;  Surgeon: Debby Hila, MD;  Location: [facility];  Service: General;  Laterality: N/A;   RECTAL BIOPSY N/A 09/22/2023   Procedure: BIOPSY, RECTUM, HEMORRHOIDOPEXY;  Surgeon: Debby Hila, MD;  Location: [facility];  Service: General;  Laterality: N/A;  POSSIBLE BIOPSY OR ABLATION, POSSIBLE HEMORRHOIDALPEXY   TUBAL LIGATION       Current Outpatient Medications  Medication Sig Dispense Refill   amLODipine (NORVASC) 2.5 MG tablet Take 1 tablet (2.5 mg total) by mouth daily. 30 tablet 11   docusate sodium (COLACE) 100 MG capsule Take 100 mg by  mouth daily.     estradiol (ESTRACE) 1 MG tablet Take 1 mg by mouth daily.     MAGNESIUM PO Take 1 capsule by mouth daily.     Multiple Vitamin (MULTIVITAMIN ADULT PO) Take 1 tablet by mouth daily. With omega 3     progesterone (PROMETRIUM) 100 MG capsule Take 100 mg by mouth at bedtime.     rosuvastatin (CRESTOR) 20 MG tablet Take 1 tablet (20 mg total) by mouth daily. 30 tablet 33   tiZANidine  (ZANAFLEX ) 4 MG tablet TAKE 1 TABLET BY MOUTH EVERYDAY AT BEDTIME 90 tablet 1   valACYclovir (VALTREX) 1000 MG tablet Take 2,000 mg by mouth daily as needed (fever blisters).     No current facility-administered medications for this visit.    Allergies:   Penicillins    Social History:  The patient  reports that she has quit smoking. Her smoking use included cigarettes. She has a 20 pack-year smoking history. She has never used smokeless tobacco. She reports current alcohol use of about 2.0 standard drinks of alcohol per week. She reports that she does not use drugs.   Family History:  The patient's family history includes Alzheimer's disease in her father, paternal aunt, and paternal uncle; Arthritis in her mother; Congestive Heart Failure in her father; Heart attack in her mother; Heart attack (age  of onset: 32) in her sister; Heart attack (age of onset: 83) in her father; Heart disease in her father and mother; Hyperlipidemia in her father; Hypertension in her father, mother, and sister; Hypothyroidism in her mother; Memory loss in her mother; Varicose Veins in her sister.    ROS:  Please see the history of present illness.   Otherwise, review of systems are positive for none.   All other systems are reviewed and negative.    PHYSICAL EXAM: VS:  BP (!) 158/90   Pulse 84   Ht 5' 3.5 (1.613 m)   Wt 137 lb (62.1 kg)   BMI 23.89 kg/m  , BMI Body mass index is 23.89 kg/m. GENERAL:  Well appearing HEENT:  Pupils equal round and reactive, fundi not visualized, oral mucosa unremarkable NECK:  No  jugular venous distention, waveform within normal limits, carotid upstroke brisk and symmetric, no bruits, no thyromegaly LYMPHATICS:  No cervical, inguinal adenopathy LUNGS:  Clear to auscultation bilaterally BACK:  No CVA tenderness CHEST:  Unremarkable HEART:  PMI not displaced or sustained,S1 and S2 within normal limits, no S3, no S4, no clicks, no rubs, no murmurs ABD:  Flat, positive bowel sounds normal in frequency in pitch, no bruits, no rebound, no guarding, no midline pulsatile mass, no hepatomegaly, no splenomegaly EXT:  2 plus pulses throughout, no edema, no cyanosis no clubbing SKIN:  No rashes no nodules NEURO:  Cranial nerves II through XII grossly intact, motor grossly intact throughout PSYCH:  Cognitively intact, oriented to person place and time    EKG:    Sinus rhythm, rate 72, axis within normal limits, intervals within normal limits, no acute ST-T wave changes.    Recent Labs: 06/18/2023: ALT 11; BUN 13; Creatinine, Ser 0.90; Hemoglobin 15.0; Platelets 240.0; Potassium 4.3; Sodium 142; TSH 1.78    Lipid Panel    Component Value Date/Time   CHOL 223 (H) 06/18/2023 0808   TRIG 87.0 06/18/2023 0808   HDL 68.50 06/18/2023 0808   CHOLHDL 3 06/18/2023 0808   VLDL 17.4 06/18/2023 0808   LDLCALC 137 (H) 06/18/2023 0808      Wt Readings from Last 3 Encounters:  10/13/23 137 lb (62.1 kg)  09/22/23 136 lb 3.2 oz (61.8 kg)  09/14/23 136 lb 3.2 oz (61.8 kg)      Other studies Reviewed: Additional studies/ records that were reviewed today include: Labs, coronary calcium score. Review of the above records demonstrates:  Please see elsewhere in the note.     ASSESSMENT AND PLAN:   Family history of earl CAD: The patient has an elevated coronary calcium score and significant risk factor with her family history.  However, she has a very aggressive active lifestyle and no symptoms.  I do not think further testing is indicated.  She needs however aggressive primary  risk reduction she and I talked about this.  Dyslipidemia: We reviewed the Mediterranean plant forward diet.  I have suggested a goal LDL in the 50s and she agrees to start Crestor 20 mg daily.  We will repeat a lipid profile and LP(a) in 3 months.  Aortic atherosclerosis: This will be managed as above with risk reduction.  Hypertension: Her blood pressure is consistently elevated.  I given this diagnosis of hypertension and suggested that she start amlodipine 2.5 mg daily and keep a blood pressure diary.  Current medicines are reviewed at length with the patient today.  The patient does not have concerns regarding medicines.  The following changes have  been made:  no change  Labs/ tests ordered today include:   Orders Placed This Encounter  Procedures   Lipoprotein A (LPA)    Disposition:   FU with with me as needed.     Signed, Lynwood Schilling, MD  10/13/2023 4:15 PM    [facility]

## 2023-10-13 ENCOUNTER — Ambulatory Visit (INDEPENDENT_AMBULATORY_CARE_PROVIDER_SITE_OTHER): Admitting: Cardiology

## 2023-10-13 ENCOUNTER — Encounter: Payer: Self-pay | Admitting: Cardiology

## 2023-10-13 VITALS — BP 158/90 | HR 84 | Ht 63.5 in | Wt 137.0 lb

## 2023-10-13 DIAGNOSIS — Z79899 Other long term (current) drug therapy: Secondary | ICD-10-CM | POA: Diagnosis not present

## 2023-10-13 DIAGNOSIS — Z8249 Family history of ischemic heart disease and other diseases of the circulatory system: Secondary | ICD-10-CM

## 2023-10-13 DIAGNOSIS — I700 Atherosclerosis of aorta: Secondary | ICD-10-CM

## 2023-10-13 DIAGNOSIS — E785 Hyperlipidemia, unspecified: Secondary | ICD-10-CM | POA: Diagnosis not present

## 2023-10-13 MED ORDER — AMLODIPINE BESYLATE 2.5 MG PO TABS: 2.5000 mg | ORAL_TABLET | Freq: Every day | ORAL | 11 refills | Status: DC

## 2023-10-13 MED ORDER — ROSUVASTATIN CALCIUM 20 MG PO TABS: 20.0000 mg | ORAL_TABLET | Freq: Every day | ORAL | 33 refills | Status: DC

## 2023-10-13 NOTE — Progress Notes (Unsigned)
  Nicole [name] [address] [place] [address] Phone: [phone] Subjective:   Nicole [name], am serving as a scribe for Dr. Arthea Claudene.  I'm seeing this patient by the request  of:  Henson, Vickie L, NP-C  CC: neck and back pain follow up   YEP:Dlagzrupcz  Nicole [name] is a 58 y.o. female coming in with complaint of back and neck pain. OMT 09/08/2203. Patient states same per usual. No new symptoms.  Medications patient has been prescribed: Zanaflex   Taking:         Reviewed prior external information including notes and imaging from previsou exam, outside providers and external EMR if available.   As well as notes that were available from care everywhere and other healthcare systems.  Past medical history, social, surgical and family history all reviewed in electronic medical record.  No pertanent information unless stated regarding to the chief complaint.   Past Medical History:  Diagnosis Date   Allergy    Seasonal   Arthritis    Nonallopathic lesion of lumbosacral region 06/15/2019    Allergies  Allergen Reactions   Penicillins Hives     Review of Systems:  No headache, visual changes, nausea, vomiting, diarrhea, constipation, dizziness, abdominal pain, skin rash, fevers, chills, night sweats, weight loss, swollen lymph nodes, body aches, joint swelling, chest pain, shortness of breath, mood changes. POSITIVE muscle aches  Objective  Blood pressure (!) 130/90, pulse 65, height 5' 3 (1.6 m), weight 136 lb (61.7 kg), SpO2 97%.   General: No apparent distress alert and oriented x3 mood and affect normal, dressed appropriately.  HEENT: Pupils equal, extraocular movements intact  Respiratory: Patient's speak in full sentences and does not appear short of breath  Cardiovascular: No lower extremity edema, non tender, no erythema  Gait relatively normal MSK:  Back shows tightness noted in the upper back on the left side.  Seems  to be in the parascapular area.  Does have some tightness noted in the neck as well more at the cervical thoracic area.  Osteopathic findings  C2 flexed rotated and side bent right C6 flexed rotated and side bent left T3 extended rotated and side bent left inhaled rib T5 extended rotated and side bent left inhaled rib L2 flexed rotated and side bent right Sacrum right on right       Assessment and Plan:  Degenerative disc disease, cervical Discussed with patient about icing regimen of home exercises.  Discussed with patient about continuing to work on [employer] cabin [employer].  Patient will be traveling.  Not taking any medication, continues to respond extremely well to osteopathic manipulation.  Follow-up again in 6 to 8 weeks.    Nonallopathic problems  Decision today to treat with OMT was based on Physical Exam  After verbal consent patient was treated with HVLA, ME, FPR techniques in cervical, rib, thoracic, lumbar, and sacral  areas  Patient tolerated the procedure well with improvement in symptoms  Patient given exercises, stretches and lifestyle modifications  See medications in patient instructions if given  Patient will follow up in 4-8 weeks    The above documentation has been reviewed and is accurate and complete Cinzia Devos M Himmat Enberg, DO          Note: This dictation was prepared with Dragon dictation along with smaller phrase technology. Any transcriptional errors that result from this process are unintentional.

## 2023-10-13 NOTE — Patient Instructions (Addendum)
 Medication Instructions:   Begin Norvasc 2.5mg  daily Begin Crestor 20mg  daily  Continue all other medications.     Labwork:  LPa - order given today  Office will contact with results via phone, letter or mychart.   You do not need to be fasting.   Testing/Procedures:  none  Follow-Up:  As needed   Any Other Special Instructions Will Be Listed Below (If Applicable).   If you need a refill on your cardiac medications before your next appointment, please call your pharmacy.

## 2023-10-25 ENCOUNTER — Ambulatory Visit (INDEPENDENT_AMBULATORY_CARE_PROVIDER_SITE_OTHER): Admitting: Family Medicine

## 2023-10-25 ENCOUNTER — Encounter: Payer: Self-pay | Admitting: Family Medicine

## 2023-10-25 VITALS — BP 130/90 | HR 65 | Ht 63.0 in | Wt 136.0 lb

## 2023-10-25 DIAGNOSIS — M9904 Segmental and somatic dysfunction of sacral region: Secondary | ICD-10-CM

## 2023-10-25 DIAGNOSIS — M5030 Other cervical disc degeneration, unspecified cervical region: Secondary | ICD-10-CM | POA: Diagnosis not present

## 2023-10-25 DIAGNOSIS — M9901 Segmental and somatic dysfunction of cervical region: Secondary | ICD-10-CM | POA: Diagnosis not present

## 2023-10-25 DIAGNOSIS — M9902 Segmental and somatic dysfunction of thoracic region: Secondary | ICD-10-CM

## 2023-10-25 DIAGNOSIS — M9908 Segmental and somatic dysfunction of rib cage: Secondary | ICD-10-CM | POA: Diagnosis not present

## 2023-10-25 DIAGNOSIS — M9903 Segmental and somatic dysfunction of lumbar region: Secondary | ICD-10-CM | POA: Diagnosis not present

## 2023-10-25 NOTE — Assessment & Plan Note (Signed)
 Discussed with patient about icing regimen of home exercises.  Discussed with patient about continuing to work on [employer] cabin [employer].  Patient will be traveling.  Not taking any medication, continues to respond extremely well to osteopathic manipulation.  Follow-up again in 6 to 8 weeks.

## 2023-10-25 NOTE — Patient Instructions (Signed)
 Good to see you! [employer] [address] See you again in 2-3 months

## 2023-11-30 ENCOUNTER — Encounter: Payer: Self-pay | Admitting: *Deleted

## 2023-12-22 NOTE — Progress Notes (Signed)
 [facility] [address] [place] [address] Phone: [phone] Subjective:   Nicole [name], am serving as a scribe for Dr. Arthea Claudene.  I'm seeing this patient by the request  of:  Henson, Vickie L, NP-C  CC: Back and neck pain follow-up  Nicole [name]  Nicole [name] is a 58 y.o. female coming in with complaint of back and neck pain. OMT 10/25/2023.  Reviewing patient's chart has been seen by cardiology and is concerned the statin may be causing some more in discomfort.  Patient states that she has been having pain in R shoulder that radiates into neck and head. Wakes up in pain. Pain causing headaches. Uses IBU prn.   Medications patient has been prescribed: Zanaflex   Taking:         Reviewed prior external information including notes and imaging from previsou exam, outside providers and external EMR if available.   As well as notes that were available from care everywhere and other healthcare systems.  Past medical history, social, surgical and family history all reviewed in electronic medical record.  No pertanent information unless stated regarding to the chief complaint.   Past Medical History:  Diagnosis Date   Allergy    Seasonal   Arthritis    Nonallopathic lesion of lumbosacral region 06/15/2019    Allergies  Allergen Reactions   Penicillins Hives     Review of Systems:  No , visual changes, nausea, vomiting, diarrhea, constipation, dizziness, abdominal pain, skin rash, fevers, chills, night sweats, weight loss, swollen lymph nodes, body aches, joint swelling, chest pain, shortness of breath, mood changes. POSITIVE muscle aches, headache  Objective  Blood pressure (!) 136/92, pulse 72, height 5' 3 (1.6 m), weight 138 lb (62.6 kg), SpO2 99%.   General: No apparent distress alert and oriented x3 mood and affect normal, dressed appropriately.  HEENT: Pupils equal, extraocular movements intact  Respiratory: Patient's  speak in full sentences and does not appear short of breath  Cardiovascular: No lower extremity edema, non tender, no erythema  Low back seems to be doing relatively well.  More of a tightness noted in the neck and right right greater than left.  Seems to be a tightness noted in the parascapular area as well.  Negative Spurling's test noted today.  Osteopathic findings  C2 flexed rotated and side bent right C5 flexed rotated and side bent left T3 extended rotated and side bent right inhaled rib T7 extended rotated and side bent left L1 flexed rotated and side bent right Sacrum right on right       Assessment and Plan:  Degenerative disc disease, cervical Degenerative disc disease.  Discussed icing regimen of home exercises, discussed which activities to do and which ones to avoid.  Increase activity slowly.  Discussed sleeping position.  Discussed potential sleep study if needed.  Follow-up again in 6 to 8 weeks otherwise.    Nonallopathic problems  Decision today to treat with OMT was based on Physical Exam  After verbal consent patient was treated with HVLA, ME, FPR techniques in cervical, rib, thoracic, lumbar, and sacral  areas  Patient tolerated the procedure well with improvement in symptoms  Patient given exercises, stretches and lifestyle modifications  See medications in patient instructions if given  Patient will follow up in 4-8 weeks     The above documentation has been reviewed and is accurate and complete Arthea CHRISTELLA Claudene, DO         Note: This dictation  was prepared with Dragon dictation along with smaller phrase technology. Any transcriptional errors that result from this process are unintentional.

## 2023-12-28 ENCOUNTER — Ambulatory Visit (INDEPENDENT_AMBULATORY_CARE_PROVIDER_SITE_OTHER): Admitting: Family Medicine

## 2023-12-28 ENCOUNTER — Encounter: Payer: Self-pay | Admitting: Family Medicine

## 2023-12-28 VITALS — BP 136/92 | HR 72 | Ht 63.0 in | Wt 138.0 lb

## 2023-12-28 DIAGNOSIS — M9901 Segmental and somatic dysfunction of cervical region: Secondary | ICD-10-CM | POA: Diagnosis not present

## 2023-12-28 DIAGNOSIS — M9904 Segmental and somatic dysfunction of sacral region: Secondary | ICD-10-CM | POA: Diagnosis not present

## 2023-12-28 DIAGNOSIS — M9903 Segmental and somatic dysfunction of lumbar region: Secondary | ICD-10-CM

## 2023-12-28 DIAGNOSIS — M5030 Other cervical disc degeneration, unspecified cervical region: Secondary | ICD-10-CM

## 2023-12-28 DIAGNOSIS — M9908 Segmental and somatic dysfunction of rib cage: Secondary | ICD-10-CM

## 2023-12-28 DIAGNOSIS — M9902 Segmental and somatic dysfunction of thoracic region: Secondary | ICD-10-CM

## 2023-12-28 NOTE — Patient Instructions (Signed)
 COOP pillow Stay active See me in 6-8 weeks depending on trip

## 2023-12-28 NOTE — Assessment & Plan Note (Signed)
 Degenerative disc disease.  Discussed icing regimen of home exercises, discussed which activities to do and which ones to avoid.  Increase activity slowly.  Discussed sleeping position.  Discussed potential sleep study if needed.  Follow-up again in 6 to 8 weeks otherwise.

## 2024-01-04 ENCOUNTER — Encounter: Payer: Self-pay | Admitting: Family Medicine

## 2024-01-04 ENCOUNTER — Ambulatory Visit: Admitting: Family Medicine

## 2024-01-04 VITALS — BP 134/92 | HR 80 | Temp 97.6°F | Ht 63.0 in | Wt 138.0 lb

## 2024-01-04 DIAGNOSIS — I700 Atherosclerosis of aorta: Secondary | ICD-10-CM

## 2024-01-04 DIAGNOSIS — E7800 Pure hypercholesterolemia, unspecified: Secondary | ICD-10-CM

## 2024-01-04 DIAGNOSIS — I10 Essential (primary) hypertension: Secondary | ICD-10-CM

## 2024-01-04 DIAGNOSIS — L719 Rosacea, unspecified: Secondary | ICD-10-CM | POA: Insufficient documentation

## 2024-01-04 LAB — COMPREHENSIVE METABOLIC PANEL WITH GFR
ALT: 13 U/L (ref 0–35)
AST: 18 U/L (ref 0–37)
Albumin: 4.5 g/dL (ref 3.5–5.2)
Alkaline Phosphatase: 63 U/L (ref 39–117)
BUN: 11 mg/dL (ref 6–23)
CO2: 30 meq/L (ref 19–32)
Calcium: 9.3 mg/dL (ref 8.4–10.5)
Chloride: 103 meq/L (ref 96–112)
Creatinine, Ser: 0.77 mg/dL (ref 0.40–1.20)
GFR: 84.05 mL/min (ref >60.00)
Glucose, Bld: 96 mg/dL (ref 70–99)
Potassium: 4.1 meq/L (ref 3.5–5.1)
Sodium: 139 meq/L (ref 135–145)
Total Bilirubin: 0.5 mg/dL (ref 0.2–1.2)
Total Protein: 6.9 g/dL (ref 6.0–8.3)

## 2024-01-04 LAB — CBC WITH DIFFERENTIAL/PLATELET
Basophils Absolute: 0 K/uL (ref 0.0–0.1)
Basophils Relative: 0.6 % (ref 0.0–3.0)
Eosinophils Absolute: 0.1 K/uL (ref 0.0–0.7)
Eosinophils Relative: 2.6 % (ref 0.0–5.0)
HCT: 44.7 % (ref 36.0–46.0)
Hemoglobin: 15 g/dL (ref 12.0–15.0)
Lymphocytes Relative: 44.3 % (ref 12.0–46.0)
Lymphs Abs: 2.5 K/uL (ref 0.7–4.0)
MCHC: 33.4 g/dL (ref 30.0–36.0)
MCV: 95.3 fl (ref 78.0–100.0)
Monocytes Absolute: 0.5 K/uL (ref 0.1–1.0)
Monocytes Relative: 8.9 % (ref 3.0–12.0)
Neutro Abs: 2.4 K/uL (ref 1.4–7.7)
Neutrophils Relative %: 43.6 % (ref 43.0–77.0)
Platelets: 223 K/uL (ref 150.0–400.0)
RBC: 4.69 Mil/uL (ref 3.87–5.11)
RDW: 13 % (ref 11.5–15.5)
WBC: 5.6 K/uL (ref 4.0–10.5)

## 2024-01-04 LAB — LIPID PANEL
Cholesterol: 215 mg/dL — ABNORMAL HIGH (ref 0–200)
HDL: 73.9 mg/dL (ref >39.00)
LDL Cholesterol: 120 mg/dL — ABNORMAL HIGH (ref 0–99)
NonHDL: 141.15
Total CHOL/HDL Ratio: 3
Triglycerides: 107 mg/dL (ref 0.0–149.0)
VLDL: 21.4 mg/dL (ref 0.0–40.0)

## 2024-01-04 MED ORDER — AMLODIPINE BESYLATE 5 MG PO TABS: 5.0000 mg | ORAL_TABLET | Freq: Every day | ORAL | 1 refills | Status: DC

## 2024-01-04 MED ORDER — ATORVASTATIN CALCIUM 10 MG PO TABS: 10.0000 mg | ORAL_TABLET | Freq: Every day | ORAL | 1 refills | Status: DC

## 2024-01-04 NOTE — Patient Instructions (Signed)
 Please go downstairs for labs before you leave  I am increasing your amlodipine  to 5 mg daily  Continue monitoring your blood pressure at home and bring in your readings in 6 weeks  I am also starting you on a different cholesterol medication called Lipitor or atorvastatin .  Follow-up fasting in 6 weeks to recheck your cholesterol levels

## 2024-01-04 NOTE — Progress Notes (Signed)
 Subjective:     Patient ID: Nicole [name], female    DOB: 1965/06/16, 58 y.o.   MRN: [id number]  Chief Complaint  Patient presents with   Medical Management of Chronic Issues    6 month f/u    HPI  Discussed the use of AI scribe software for clinical note transcription with the patient, who gave verbal consent to proceed.  History of Present Illness Nicole [name] is a 58 year old female with hyperlipidemia who presents for a six-month follow-up of chronic health conditions.  Hypertension - Initiated amlodipine  2.5 mg in July 2025 - Blood pressure readings have been variable, with some elevated values such as 145/96 mmHg - No swelling, dizziness, palpitations, or shortness of breath  Statin-associated myalgia and fatigue - Experienced muscle aches and fatigue while taking Crestor  - Crestor  discontinued on December 16, 2023 - No follow-up LPA test conducted since discontinuation - Feels better since stopping Crestor  - No gastrointestinal symptoms  She saw Dr. Edison in July. Advised to get LDL in 50s. Started on Crestor  but had myalgias. Started amlodipine  2.5 mg.  LP (a) ordered but not done     Health Maintenance Due  Topic Date Due   Lung Cancer Screening  Never done    Past Medical History:  Diagnosis Date   Allergy    Seasonal   Arthritis    Nonallopathic lesion of lumbosacral region 06/15/2019    Past Surgical History:  Procedure Laterality Date   ablasion     BREAST SURGERY  25years ago   COLONOSCOPY  ? 2012   Dr.Mann-polyps-recall 5 years per pt   HIGH RESOLUTION ANOSCOPY N/A 09/22/2023   Procedure: ANOSCOPY, HIGH RESOLUTION;  Surgeon: Debby Hila, MD;  Location: [facility];  Service: General;  Laterality: N/A;   RECTAL BIOPSY N/A 09/22/2023   Procedure: BIOPSY, RECTUM, HEMORRHOIDOPEXY;  Surgeon: Debby Hila, MD;  Location: [facility];  Service: General;  Laterality: N/A;  POSSIBLE BIOPSY OR ABLATION, POSSIBLE HEMORRHOIDALPEXY   TUBAL LIGATION       Family History  Problem Relation Age of Onset   Hypertension Mother    Hypothyroidism Mother    Arthritis Mother    Heart disease Mother    Heart attack Mother        with covid age 32   Memory loss Mother    Heart attack Father 87   Heart disease Father    Alzheimer's disease Father    Congestive Heart Failure Father    Hyperlipidemia Father    Hypertension Father    Heart attack Sister 88   Hypertension Sister    Varicose Veins Sister    Alzheimer's disease Paternal Aunt    Alzheimer's disease Paternal Uncle    Colon polyps Neg Hx    Colon cancer Neg Hx    Esophageal cancer Neg Hx    Stomach cancer Neg Hx    Rectal cancer Neg Hx     Social History   Socioeconomic History   Marital status: Married    Spouse name: Not on file   Number of children: 2   Years of education: Not on file   Highest education level: Bachelor's degree (e.g., BA, AB, BS)  Occupational History   Occupation: Conservator, museum/gallery for [employer]  Tobacco Use   Smoking status: Former    Current packs/day: 1.00    Average packs/day: 1 pack/day for 20.0 years (20.0 ttl pk-yrs)    Types: Cigarettes   Smokeless tobacco: Never  Vaping Use  Vaping status: Never Used  Substance and Sexual Activity   Alcohol use: Yes    Alcohol/week: 2.0 standard drinks of alcohol    Types: 2 Glasses of wine per week   Drug use: No   Sexual activity: Yes    Birth control/protection: Surgical  Other Topics Concern   Not on file  Social History Narrative   Not on file   Social Drivers of Health   Financial Resource Strain: Low Risk  (12/31/2023)   Overall Financial Resource Strain (CARDIA)    Difficulty of Paying Living Expenses: Not hard at all  Food Insecurity: No Food Insecurity (12/31/2023)   Hunger Vital Sign    Worried About Running Out of Food in the Last Year: Never true    Ran Out of Food in the Last Year: Never true  Transportation Needs: No Transportation Needs (12/31/2023)   PRAPARE -  Administrator, Civil Service (Medical): No    Lack of Transportation (Non-Medical): No  Physical Activity: Sufficiently Active (12/31/2023)   Exercise Vital Sign    Days of Exercise per Week: 5 days    Minutes of Exercise per Session: 30 min  Stress: No Stress Concern Present (12/31/2023)   [employer] of Occupational Health - Occupational Stress Questionnaire    Feeling of Stress: Only a little  Social Connections: Moderately Integrated (12/31/2023)   Social Connection and Isolation Panel    Frequency of Communication with Friends and Family: More than three times a week    Frequency of Social Gatherings with Friends and Family: Once a week    Attends Religious Services: More than 4 times per year    Active Member of [employer] or Organizations: No    Attends Engineer, structural: Not on file    Marital Status: Married  Catering manager Violence: Not on file    Outpatient Medications Prior to Visit  Medication Sig Dispense Refill   docusate sodium (COLACE) 100 MG capsule Take 100 mg by mouth daily.     estradiol (ESTRACE) 1 MG tablet Take 1 mg by mouth daily.     MAGNESIUM PO Take 1 capsule by mouth daily.     Multiple Vitamin (MULTIVITAMIN ADULT PO) Take 1 tablet by mouth daily. With omega 3     progesterone (PROMETRIUM) 100 MG capsule Take 100 mg by mouth at bedtime.     tiZANidine  (ZANAFLEX ) 4 MG tablet TAKE 1 TABLET BY MOUTH EVERYDAY AT BEDTIME 90 tablet 1   valACYclovir (VALTREX) 1000 MG tablet Take 2,000 mg by mouth daily as needed (fever blisters).     amLODipine  (NORVASC ) 2.5 MG tablet Take 1 tablet (2.5 mg total) by mouth daily. 30 tablet 11   rosuvastatin  (CRESTOR ) 20 MG tablet Take 1 tablet (20 mg total) by mouth daily. (Patient not taking: Reported on 01/04/2024) 30 tablet 33   No facility-administered medications prior to visit.    Allergies  Allergen Reactions   Penicillins Hives    Review of Systems  Constitutional:  Negative for chills,  fever and malaise/fatigue.  Respiratory:  Negative for shortness of breath.   Cardiovascular:  Negative for chest pain, palpitations and leg swelling.  Gastrointestinal:  Negative for abdominal pain, constipation, diarrhea, nausea and vomiting.  Genitourinary:  Negative for dysuria, frequency and urgency.  Musculoskeletal:  Positive for myalgias.       Resolved after stopping Crestor   Neurological:  Negative for dizziness, focal weakness and headaches.       Objective:  Physical Exam Constitutional:      General: She is not in acute distress.    Appearance: She is not ill-appearing.  HENT:     Mouth/Throat:     Mouth: Mucous membranes are moist.     Pharynx: Oropharynx is clear.  Eyes:     Extraocular Movements: Extraocular movements intact.     Conjunctiva/sclera: Conjunctivae normal.     Pupils: Pupils are equal, round, and reactive to light.  Cardiovascular:     Rate and Rhythm: Normal rate and regular rhythm.  Pulmonary:     Effort: Pulmonary effort is normal.     Breath sounds: Normal breath sounds.  Musculoskeletal:     Cervical back: Normal range of motion and neck supple.     Right lower leg: No edema.     Left lower leg: No edema.  Skin:    General: Skin is warm and dry.  Neurological:     General: No focal deficit present.     Mental Status: She is alert and oriented to person, place, and time.  Psychiatric:        Mood and Affect: Mood normal.        Behavior: Behavior normal.        Thought Content: Thought content normal.      BP (!) 134/92   Pulse 80   Temp 97.6 F (36.4 C) (Temporal)   Ht 5' 3 (1.6 m)   Wt 138 lb (62.6 kg)   SpO2 98%   BMI 24.45 kg/m  Wt Readings from Last 3 Encounters:  01/04/24 138 lb (62.6 kg)  12/28/23 138 lb (62.6 kg)  10/25/23 136 lb (61.7 kg)       Assessment & Plan:   Problem List Items Addressed This Visit     Aortic atherosclerosis   Relevant Medications   amLODipine  (NORVASC ) 5 MG tablet   atorvastatin   (LIPITOR) 10 MG tablet   Other Relevant Orders   Lipid panel   Lipoprotein A (LPA)   Hyperlipidemia   Relevant Medications   amLODipine  (NORVASC ) 5 MG tablet   atorvastatin  (LIPITOR) 10 MG tablet   Other Relevant Orders   Lipid panel   Lipoprotein A (LPA)   Other Visit Diagnoses       Primary hypertension    -  Primary   Relevant Medications   amLODipine  (NORVASC ) 5 MG tablet   atorvastatin  (LIPITOR) 10 MG tablet   Other Relevant Orders   CBC with Differential/Platelet   Comprehensive metabolic panel with GFR       Assessment and Plan Assessment & Plan Hypertension Blood pressure readings are mostly elevated, with some as high as 145/96 mmHg. She is currently on a low dose of amlodipine  (2.5 mg) with no formal diagnosis of hypertension previously documented. Increasing the dose of amlodipine  is necessary to achieve better blood pressure control and prevent cardiovascular events. - Increase amlodipine  to 5 mg daily. - Monitor blood pressure at home and bring readings to next appointment. - Schedule follow-up in six weeks to assess blood pressure control.  Hyperlipidemia Previously on Crestor , which caused muscle aches and fatigue. LDL goal is in the 50s. No LP (a) test done due to discontinuation of Crestor . Plan to start Lipitor at a low dose to improve tolerance. If Lipitor is not tolerated, Repatha will be considered as an alternative. - Order LPA and cholesterol tests. - Prescribe Lipitor at a low dose. - Monitor cholesterol levels and assess tolerance to Lipitor. - Consider Repatha if  Lipitor is not tolerated. - Schedule follow-up in six weeks to check cholesterol levels on Lipitor.  Aortic atherosclerosis Need to manage cholesterol and blood pressure to prevent further cardiovascular events. Current management plan for hyperlipidemia and hypertension aims to reduce cardiovascular risk. - Continue with current management plan for hyperlipidemia and hypertension to  reduce cardiovascular risk.  Follow-Up Follow-up plan discussed to monitor treatment efficacy and adjust as needed. - Schedule follow-up appointment in six weeks. - Review blood pressure and cholesterol results at follow-up.   Reviewed note and results from Dr. Lavona in July.   I have discontinued Raniah Schamberger Lisa's amLODipine  and rosuvastatin . I am also having her start on amLODipine  and atorvastatin . Additionally, I am having her maintain her estradiol, progesterone, MAGNESIUM PO, Multiple Vitamin (MULTIVITAMIN ADULT PO), tiZANidine , docusate sodium, and valACYclovir.  Meds ordered this encounter  Medications   amLODipine  (NORVASC ) 5 MG tablet    Sig: Take 1 tablet (5 mg total) by mouth daily.    Dispense:  90 tablet    Refill:  1    Supervising Provider:   ROLLENE NORRIS A [[id number]]   atorvastatin  (LIPITOR) 10 MG tablet    Sig: Take 1 tablet (10 mg total) by mouth daily.    Dispense:  90 tablet    Refill:  1    Supervising Provider:   ROLLENE NORRIS A [[id number]]

## 2024-01-05 ENCOUNTER — Telehealth: Payer: Self-pay | Admitting: Radiology

## 2024-01-05 DIAGNOSIS — I10 Essential (primary) hypertension: Secondary | ICD-10-CM

## 2024-01-05 DIAGNOSIS — E7800 Pure hypercholesterolemia, unspecified: Secondary | ICD-10-CM

## 2024-01-05 MED ORDER — ATORVASTATIN CALCIUM 10 MG PO TABS: 10.0000 mg | ORAL_TABLET | Freq: Every day | ORAL | 6 refills | Status: DC

## 2024-01-05 MED ORDER — AMLODIPINE BESYLATE 5 MG PO TABS: 5.0000 mg | ORAL_TABLET | Freq: Every day | ORAL | 6 refills | Status: AC

## 2024-01-05 NOTE — Telephone Encounter (Signed)
 Copied from CRM [id number]. Topic: Clinical - Medication Question >> Jan 05, 2024 11:35 AM Harlene ORN wrote: Reason for CRM: Patient called to ask about the medication she was prescribed yesterday by her PCP. She was supposed to receive a 90 day supply, but her pharmacy only her 30 days. Will she be getting 30 per refill? Or should she get all 90 at once?  Please call back and advise.

## 2024-01-05 NOTE — Telephone Encounter (Signed)
 Called pharmacy as per our records we sent in a 90 day supply, they state that insurance only covers 30 days at a time.   Called and notified pt of this, will adjust rx scripts.

## 2024-01-05 NOTE — Addendum Note (Signed)
 Addended by: Carole Deere E on: 01/05/2024 03:53 PM   Modules accepted: Orders

## 2024-01-07 LAB — LIPOPROTEIN A (LPA): Lipoprotein (a): 22 nmol/L (ref <75)

## 2024-01-10 ENCOUNTER — Ambulatory Visit: Payer: Self-pay | Admitting: Family Medicine

## 2024-01-10 DIAGNOSIS — E7800 Pure hypercholesterolemia, unspecified: Secondary | ICD-10-CM

## 2024-01-11 ENCOUNTER — Ambulatory Visit: Payer: Self-pay | Admitting: General Surgery

## 2024-01-11 NOTE — H&P (View-Only) (Signed)
 PROVIDER:  BERNARDA WANDA NED, MD  MRN: [id number] DOB: 09/23/1965 DATE OF ENCOUNTER: 01/11/2024 Interval History:   Pt underwent HRA and hemorrhoidal pexy on 09/22/23.  She is doing well with this and denies any further bleeding.  Biopsies were negative for any dysplasia.  Her hemorrhoidal pexy worked for a few months but then she has started developing rectal bleeding again.  She is here today to discuss further treatment for her hemorrhoid.  Past Medical History:  Diagnosis Date   Allergy    Seasonal   Arthritis    Nonallopathic lesion of lumbosacral region 06/15/2019    Past Surgical History:  Procedure Laterality Date   ablasion     BREAST SURGERY  25years ago   COLONOSCOPY  ? 2012   Dr.Mann-polyps-recall 5 years per pt   HIGH RESOLUTION ANOSCOPY N/A 09/22/2023   Procedure: ANOSCOPY, HIGH RESOLUTION;  Surgeon: NED BERNARDA, MD;  Location: [facility];  Service: General;  Laterality: N/A;   RECTAL BIOPSY N/A 09/22/2023   Procedure: BIOPSY, RECTUM, HEMORRHOIDOPEXY;  Surgeon: NED BERNARDA, MD;  Location: [facility];  Service: General;  Laterality: N/A;  POSSIBLE BIOPSY OR ABLATION, POSSIBLE HEMORRHOIDALPEXY   TUBAL LIGATION      Family History  Problem Relation Age of Onset   Hypertension Mother    Hypothyroidism Mother    Arthritis Mother    Heart disease Mother    Heart attack Mother        with covid age 54   Memory loss Mother    Heart attack Father 44   Heart disease Father    Alzheimer's disease Father    Congestive Heart Failure Father    Hyperlipidemia Father    Hypertension Father    Heart attack Sister 43   Hypertension Sister    Varicose Veins Sister    Alzheimer's disease Paternal Aunt    Alzheimer's disease Paternal Uncle    Colon polyps Neg Hx    Colon cancer Neg Hx    Esophageal cancer Neg Hx    Stomach cancer Neg Hx    Rectal cancer Neg Hx     Social History   Socioeconomic History   Marital status: Married    Spouse name: Not on file    Number of children: 2   Years of education: Not on file   Highest education level: Bachelor's degree (e.g., BA, AB, BS)  Occupational History   Occupation: Conservator, museum/gallery for [employer]  Tobacco Use   Smoking status: Former    Current packs/day: 1.00    Average packs/day: 1 pack/day for 20.0 years (20.0 ttl pk-yrs)    Types: Cigarettes   Smokeless tobacco: Never  Vaping Use   Vaping status: Never Used  Substance and Sexual Activity   Alcohol use: Yes    Alcohol/week: 2.0 standard drinks of alcohol    Types: 2 Glasses of wine per week   Drug use: No   Sexual activity: Yes    Birth control/protection: Surgical  Other Topics Concern   Not on file  Social History Narrative   Not on file   Social Drivers of Health   Financial Resource Strain: Low Risk  (12/31/2023)   Overall Financial Resource Strain (CARDIA)    Difficulty of Paying Living Expenses: Not hard at all  Food Insecurity: No Food Insecurity (12/31/2023)   Hunger Vital Sign    Worried About Running Out of Food in the Last Year: Never true    Ran Out of Food in the Last Year: Never  true  Transportation Needs: No Transportation Needs (12/31/2023)   PRAPARE - Administrator, Civil Service (Medical): No    Lack of Transportation (Non-Medical): No  Physical Activity: Sufficiently Active (12/31/2023)   Exercise Vital Sign    Days of Exercise per Week: 5 days    Minutes of Exercise per Session: 30 min  Stress: No Stress Concern Present (12/31/2023)   [employer] of Occupational Health - Occupational Stress Questionnaire    Feeling of Stress: Only a little  Social Connections: Moderately Integrated (12/31/2023)   Social Connection and Isolation Panel    Frequency of Communication with Friends and Family: More than three times a week    Frequency of Social Gatherings with Friends and Family: Once a week    Attends Religious Services: More than 4 times per year    Active Member of [employer] or Organizations: No     Attends Engineer, structural: Not on file    Marital Status: Married  Catering manager Violence: Not on file     Current Outpatient Medications:    amLODipine  (NORVASC ) 5 MG tablet, Take 1 tablet (5 mg total) by mouth daily., Disp: 30 tablet, Rfl: 6   atorvastatin  (LIPITOR) 10 MG tablet, Take 1 tablet (10 mg total) by mouth daily., Disp: 30 tablet, Rfl: 6   docusate sodium (COLACE) 100 MG capsule, Take 100 mg by mouth daily., Disp: , Rfl:    estradiol (ESTRACE) 1 MG tablet, Take 1 mg by mouth daily., Disp: , Rfl:    MAGNESIUM PO, Take 1 capsule by mouth daily., Disp: , Rfl:    Multiple Vitamin (MULTIVITAMIN ADULT PO), Take 1 tablet by mouth daily. With omega 3, Disp: , Rfl:    progesterone (PROMETRIUM) 100 MG capsule, Take 100 mg by mouth at bedtime., Disp: , Rfl:    tiZANidine  (ZANAFLEX ) 4 MG tablet, TAKE 1 TABLET BY MOUTH EVERYDAY AT BEDTIME, Disp: 90 tablet, Rfl: 1   valACYclovir (VALTREX) 1000 MG tablet, Take 2,000 mg by mouth daily as needed (fever blisters)., Disp: , Rfl:   Allergies  Allergen Reactions   Penicillins Hives    Review of Systems - Negative except as stated above    Physical Examination:   Gen: NAD CV: RRR Pulm: CTA Abd: soft   Assessment and Plan:   Nicole [name] is a 58 y.o. female who underwent hemorrhoidal pexy of her RP hemorrhoid.  She has had recurrence of her bleeding symptoms.  We discussed treatment of her right posterior prolapsing hemorrhoid.  We discussed that a single column hemorrhoidectomy would be the most successful way to eliminate her bleeding.  We discussed rubber band ligation would be similar to the procedure that she has already had and most likely would not help in the long run.  She would like to proceed with hemorrhoidectomy.  We discussed postoperative pain and bleeding, as well as healing time.  Diagnoses and all orders for this visit:  Grade II hemorrhoids     No follow-ups on file.   The plan was discussed in  detail with the patient today, who expressed understanding.  The patient has my contact information, and understands to call me with any additional questions or concerns in the interval.  I would be happy to see the patient back sooner if the need arises.   Bernarda JAYSON Ned, MD Colon and Rectal Surgery [facility]

## 2024-01-11 NOTE — H&P (Signed)
 PROVIDER:  BERNARDA WANDA NED, MD  MRN: [id number] DOB: 09/23/1965 DATE OF ENCOUNTER: 01/11/2024 Interval History:   Pt underwent HRA and hemorrhoidal pexy on 09/22/23.  She is doing well with this and denies any further bleeding.  Biopsies were negative for any dysplasia.  Her hemorrhoidal pexy worked for a few months but then she has started developing rectal bleeding again.  She is here today to discuss further treatment for her hemorrhoid.  Past Medical History:  Diagnosis Date   Allergy    Seasonal   Arthritis    Nonallopathic lesion of lumbosacral region 06/15/2019    Past Surgical History:  Procedure Laterality Date   ablasion     BREAST SURGERY  25years ago   COLONOSCOPY  ? 2012   Dr.Mann-polyps-recall 5 years per pt   HIGH RESOLUTION ANOSCOPY N/A 09/22/2023   Procedure: ANOSCOPY, HIGH RESOLUTION;  Surgeon: NED BERNARDA, MD;  Location: [facility];  Service: General;  Laterality: N/A;   RECTAL BIOPSY N/A 09/22/2023   Procedure: BIOPSY, RECTUM, HEMORRHOIDOPEXY;  Surgeon: NED BERNARDA, MD;  Location: [facility];  Service: General;  Laterality: N/A;  POSSIBLE BIOPSY OR ABLATION, POSSIBLE HEMORRHOIDALPEXY   TUBAL LIGATION      Family History  Problem Relation Age of Onset   Hypertension Mother    Hypothyroidism Mother    Arthritis Mother    Heart disease Mother    Heart attack Mother        with covid age 54   Memory loss Mother    Heart attack Father 44   Heart disease Father    Alzheimer's disease Father    Congestive Heart Failure Father    Hyperlipidemia Father    Hypertension Father    Heart attack Sister 43   Hypertension Sister    Varicose Veins Sister    Alzheimer's disease Paternal Aunt    Alzheimer's disease Paternal Uncle    Colon polyps Neg Hx    Colon cancer Neg Hx    Esophageal cancer Neg Hx    Stomach cancer Neg Hx    Rectal cancer Neg Hx     Social History   Socioeconomic History   Marital status: Married    Spouse name: Not on file    Number of children: 2   Years of education: Not on file   Highest education level: Bachelor's degree (e.g., BA, AB, BS)  Occupational History   Occupation: Conservator, museum/gallery for [employer]  Tobacco Use   Smoking status: Former    Current packs/day: 1.00    Average packs/day: 1 pack/day for 20.0 years (20.0 ttl pk-yrs)    Types: Cigarettes   Smokeless tobacco: Never  Vaping Use   Vaping status: Never Used  Substance and Sexual Activity   Alcohol use: Yes    Alcohol/week: 2.0 standard drinks of alcohol    Types: 2 Glasses of wine per week   Drug use: No   Sexual activity: Yes    Birth control/protection: Surgical  Other Topics Concern   Not on file  Social History Narrative   Not on file   Social Drivers of Health   Financial Resource Strain: Low Risk  (12/31/2023)   Overall Financial Resource Strain (CARDIA)    Difficulty of Paying Living Expenses: Not hard at all  Food Insecurity: No Food Insecurity (12/31/2023)   Hunger Vital Sign    Worried About Running Out of Food in the Last Year: Never true    Ran Out of Food in the Last Year: Never  true  Transportation Needs: No Transportation Needs (12/31/2023)   PRAPARE - Administrator, Civil Service (Medical): No    Lack of Transportation (Non-Medical): No  Physical Activity: Sufficiently Active (12/31/2023)   Exercise Vital Sign    Days of Exercise per Week: 5 days    Minutes of Exercise per Session: 30 min  Stress: No Stress Concern Present (12/31/2023)   [employer] of Occupational Health - Occupational Stress Questionnaire    Feeling of Stress: Only a little  Social Connections: Moderately Integrated (12/31/2023)   Social Connection and Isolation Panel    Frequency of Communication with Friends and Family: More than three times a week    Frequency of Social Gatherings with Friends and Family: Once a week    Attends Religious Services: More than 4 times per year    Active Member of [employer] or Organizations: No     Attends Engineer, structural: Not on file    Marital Status: Married  Catering manager Violence: Not on file     Current Outpatient Medications:    amLODipine  (NORVASC ) 5 MG tablet, Take 1 tablet (5 mg total) by mouth daily., Disp: 30 tablet, Rfl: 6   atorvastatin  (LIPITOR) 10 MG tablet, Take 1 tablet (10 mg total) by mouth daily., Disp: 30 tablet, Rfl: 6   docusate sodium (COLACE) 100 MG capsule, Take 100 mg by mouth daily., Disp: , Rfl:    estradiol (ESTRACE) 1 MG tablet, Take 1 mg by mouth daily., Disp: , Rfl:    MAGNESIUM PO, Take 1 capsule by mouth daily., Disp: , Rfl:    Multiple Vitamin (MULTIVITAMIN ADULT PO), Take 1 tablet by mouth daily. With omega 3, Disp: , Rfl:    progesterone (PROMETRIUM) 100 MG capsule, Take 100 mg by mouth at bedtime., Disp: , Rfl:    tiZANidine  (ZANAFLEX ) 4 MG tablet, TAKE 1 TABLET BY MOUTH EVERYDAY AT BEDTIME, Disp: 90 tablet, Rfl: 1   valACYclovir (VALTREX) 1000 MG tablet, Take 2,000 mg by mouth daily as needed (fever blisters)., Disp: , Rfl:   Allergies  Allergen Reactions   Penicillins Hives    Review of Systems - Negative except as stated above    Physical Examination:   Gen: NAD CV: RRR Pulm: CTA Abd: soft   Assessment and Plan:   Ashlay Altieri is a 58 y.o. female who underwent hemorrhoidal pexy of her RP hemorrhoid.  She has had recurrence of her bleeding symptoms.  We discussed treatment of her right posterior prolapsing hemorrhoid.  We discussed that a single column hemorrhoidectomy would be the most successful way to eliminate her bleeding.  We discussed rubber band ligation would be similar to the procedure that she has already had and most likely would not help in the long run.  She would like to proceed with hemorrhoidectomy.  We discussed postoperative pain and bleeding, as well as healing time.  Diagnoses and all orders for this visit:  Grade II hemorrhoids     No follow-ups on file.   The plan was discussed in  detail with the patient today, who expressed understanding.  The patient has my contact information, and understands to call me with any additional questions or concerns in the interval.  I would be happy to see the patient back sooner if the need arises.   Bernarda JAYSON Ned, MD Colon and Rectal Surgery [facility]

## 2024-01-18 MED ORDER — ROSUVASTATIN CALCIUM 10 MG PO TABS: 10.0000 mg | ORAL_TABLET | Freq: Every day | ORAL | 0 refills | Status: DC

## 2024-01-24 NOTE — Patient Instructions (Signed)
 SURGICAL WAITING ROOM VISITATION Patients having surgery or a procedure may have no more than 2 support people in the waiting area - these visitors may rotate in the visitor waiting room.   Due to an increase in RSV and influenza rates and associated hospitalizations, children ages 65 and under may not visit patients in [facility] hospitals. If the patient needs to stay at the hospital during part of their recovery, the visitor guidelines for inpatient rooms apply.  PRE-OP VISITATION  Pre-op nurse will coordinate an appropriate time for 1 support person to accompany the patient in pre-op.  This support person may not rotate.  This visitor will be contacted when the time is appropriate for the visitor to come back in the pre-op area.  Please refer to the [facility] website for the visitor guidelines for Inpatients (after your surgery is over and you are in a regular room).  You are not required to quarantine at this time prior to your surgery. However, you must do this: Hand Hygiene often Do NOT share personal items Notify your provider if you are in close contact with someone who has COVID or you develop fever 100.4 or greater, new onset of sneezing, cough, sore throat, shortness of breath or body aches.  If you test positive for Covid or have been in contact with anyone that has tested positive in the last 10 days please notify you surgeon.    Your procedure is scheduled on:  01/27/24  Report to [facility] Main Entrance: [place] entrance where the [employer] is available.   Report to admitting at: 12:45 PM  Call this number if you have any questions or problems the morning of surgery [phone]  FOLLOW ANY ADDITIONAL PRE OP INSTRUCTIONS YOU RECEIVED FROM YOUR SURGEON'S OFFICE!!!  Do not eat food after Midnight the night prior to your surgery/procedure.  After Midnight you may have the following liquids until: 12:00 PM DAY OF SURGERY  Clear Liquid Diet Water Black  Coffee (sugar ok, NO MILK/CREAM OR CREAMERS)  Tea (sugar ok, NO MILK/CREAM OR CREAMERS) regular and decaf                             Plain Jell-O  with no fruit (NO RED)                                           Fruit ices (not with fruit pulp, NO RED)                                     Popsicles (NO RED)                                                                  Juice: NO CITRUS JUICES: only apple, WHITE grape, WHITE cranberry Sports drinks like Gatorade or Powerade (NO RED)   Oral Hygiene is also important to reduce your risk of infection.        Remember - BRUSH YOUR TEETH THE MORNING OF SURGERY WITH YOUR REGULAR TOOTHPASTE  Do NOT smoke after Midnight the night before surgery.  STOP TAKING all Vitamins, Herbs and supplements 1 week before your surgery.   Take ONLY these medicines the morning of surgery with A SIP OF WATER: amlodipine ,estradiol.                   You may not have any metal on your body including hair pins, jewelry, and body piercing  Do not wear make-up, lotions, powders, perfumes / cologne, or deodorant  Do not wear nail polish including gel and S&S, artificial / acrylic nails, or any other type of covering on natural nails including finger and toenails. If you have artificial nails, gel coating, etc., that needs to be removed by a nail salon, Please have this removed prior to surgery. Not doing so may mean that your surgery could be cancelled or delayed if the Surgeon or anesthesia staff feels like they are unable to monitor you safely.   Do not shave 48 hours prior to surgery to avoid nicks in your skin which may contribute to postoperative infections.   Contacts, Hearing Aids, dentures or bridgework may not be worn into surgery. DENTURES WILL BE REMOVED PRIOR TO SURGERY PLEASE DO NOT APPLY Poly grip OR ADHESIVES!!!  You may bring a small overnight bag with you on the day of surgery, only pack items that are not valuable. [place] IS NOT RESPONSIBLE    FOR VALUABLES THAT ARE LOST OR STOLEN.   Patients discharged on the day of surgery will not be allowed to drive home.  Someone NEEDS to stay with you for the first 24 hours after anesthesia.  Do not bring your home medications to the hospital. The Pharmacy will dispense medications listed on your medication list to you during your admission in the Hospital.  Special Instructions: Bring a copy of your healthcare power of attorney and living will documents the day of surgery, if you wish to have them scanned into your [place] [facility] Records- EPIC  Please read over the following fact sheets you were given: IF YOU HAVE QUESTIONS ABOUT YOUR PRE-OP INSTRUCTIONS, PLEASE CALL [phone]   [facility] - Preparing for Surgery      Before surgery, you can play an important role.  Because skin is not sterile, your skin needs to be as free of germs as possible.  You can reduce the number of germs on your skin by washing with CHG (chlorahexidine gluconate) soap before surgery.  CHG is an antiseptic cleaner which kills germs and bonds with the skin to continue killing germs even after washing. Please DO NOT use if you have an allergy to CHG or antibacterial soaps.  If your skin becomes reddened/irritated stop using the CHG and inform your nurse when you arrive at Short Stay. Do not shave (including legs and underarms) for at least 48 hours prior to the first CHG shower.  You may shave your face/neck.  Please follow these instructions carefully:  1.  Shower with CHG Soap the night before surgery ONLY (DO NOT USE THE SOAP THE MORNING OF SURGERY).  2.  If you choose to wash your hair, wash your hair first as usual with your normal  shampoo.  3.  After you shampoo, rinse your hair and body thoroughly to remove the shampoo.                             4.  Use CHG as you would  any other liquid soap.  You can apply chg directly to the skin and wash.  Gently with a scrungie or clean washcloth.  5.  Apply the CHG  Soap to your body ONLY FROM THE NECK DOWN.   Do not use on face/ open                           Wound or open sores. Avoid contact with eyes, ears mouth and genitals (private parts).                       Wash face,  Genitals (private parts) with your normal soap.             6.  Wash thoroughly, paying special attention to the area where your  surgery  will be performed.  7.  Thoroughly rinse your body with warm water from the neck down.  8.  DO NOT shower/wash with your normal soap after using and rinsing off the CHG Soap.                9.  Pat yourself dry with a clean towel.            10.  Wear clean pajamas.            11.  Place clean sheets on your bed the night of your first shower and do not  sleep with pets.  Day of Surgery : Do not apply any CHG, lotions/deodorants the morning of surgery.  Please wear clean clothes to the hospital/surgery center.   FAILURE TO FOLLOW THESE INSTRUCTIONS MAY RESULT IN THE CANCELLATION OF YOUR SURGERY  PATIENT SIGNATURE_________________________________  NURSE SIGNATURE__________________________________  ________________________________________________________________________

## 2024-01-25 ENCOUNTER — Other Ambulatory Visit: Payer: Self-pay

## 2024-01-25 ENCOUNTER — Encounter (HOSPITAL_COMMUNITY)
Admission: RE | Admit: 2024-01-25 | Discharge: 2024-01-25 | Disposition: A | Discharge status: Home / Self Care | Source: Ambulatory Visit | Attending: General Surgery | Admitting: General Surgery

## 2024-01-25 ENCOUNTER — Encounter (HOSPITAL_COMMUNITY): Payer: Self-pay

## 2024-01-25 DIAGNOSIS — Z01818 Encounter for other preprocedural examination: Secondary | ICD-10-CM | POA: Insufficient documentation

## 2024-01-25 HISTORY — DX: Essential (primary) hypertension: I10

## 2024-01-25 NOTE — Progress Notes (Signed)
 For Anesthesia: PCP - Lendia Boby CROME, NP-C  Cardiologist - Lavona Agent, MD . ARNETTA: 10/13/23  Bowel Prep reminder:  Chest x-ray - CT cardiac: 07/17/23 EKG - 06/25/23 Stress Test -  ECHO -  Cardiac Cath -  Pacemaker/ICD device last checked: Pacemaker orders received: Device Rep notified:  Spinal Cord Stimulator:  Sleep Study -  CPAP -   Fasting Blood Sugar -  Checks Blood Sugar _____ times a day Date and result of last Hgb A1c-  Last dose of GLP1 agonist-  GLP1 instructions: Hold 7 days prior to schedule (Hold 24 hours-daily)   Last dose of SGLT-2 inhibitors-  SGLT-2 instructions: Hold 72 hours prior to surgery  Blood Thinner Instructions: Last Dose: Time last taken:  Aspirin Instructions: Last Dose: Time last taken:  Activity level: Can go up a flight of stairs and activities of daily living without stopping and without chest pain and/or shortness of breath   Able to exercise without chest pain and/or shortness of breath  Anesthesia review: Hx: Aortic atherosclerosis,HTN   Patient denies shortness of breath, fever, cough and chest pain at PAT appointment   Patient verbalized understanding of instructions that were reviewed over the telephone.

## 2024-01-27 ENCOUNTER — Encounter (HOSPITAL_COMMUNITY)
Admission: RE | Disposition: A | Discharge status: Home / Self Care | Payer: Self-pay | Source: Ambulatory Visit | Attending: General Surgery

## 2024-01-27 ENCOUNTER — Ambulatory Visit (HOSPITAL_COMMUNITY): Admitting: Certified Registered"

## 2024-01-27 ENCOUNTER — Ambulatory Visit (HOSPITAL_COMMUNITY)
Admission: RE | Admit: 2024-01-27 | Discharge: 2024-01-27 | Disposition: A | Discharge status: Home / Self Care | Source: Ambulatory Visit | Attending: General Surgery | Admitting: General Surgery

## 2024-01-27 ENCOUNTER — Encounter (HOSPITAL_COMMUNITY): Payer: Self-pay | Admitting: General Surgery

## 2024-01-27 ENCOUNTER — Other Ambulatory Visit: Payer: Self-pay

## 2024-01-27 DIAGNOSIS — Z79899 Other long term (current) drug therapy: Secondary | ICD-10-CM | POA: Insufficient documentation

## 2024-01-27 DIAGNOSIS — Z9889 Other specified postprocedural states: Secondary | ICD-10-CM | POA: Diagnosis not present

## 2024-01-27 DIAGNOSIS — I10 Essential (primary) hypertension: Secondary | ICD-10-CM | POA: Insufficient documentation

## 2024-01-27 DIAGNOSIS — K641 Second degree hemorrhoids: Secondary | ICD-10-CM | POA: Diagnosis present

## 2024-01-27 DIAGNOSIS — Z87891 Personal history of nicotine dependence: Secondary | ICD-10-CM | POA: Diagnosis not present

## 2024-01-27 DIAGNOSIS — K642 Third degree hemorrhoids: Secondary | ICD-10-CM | POA: Insufficient documentation

## 2024-01-27 HISTORY — PX: HEMORRHOID SURGERY: SHX153

## 2024-01-27 SURGERY — HEMORRHOIDECTOMY
Anesthesia: Monitor Anesthesia Care | Site: Rectum

## 2024-01-27 MED ORDER — MIDAZOLAM HCL 2 MG/2ML IJ SOLN
INTRAMUSCULAR | Status: AC
  Filled 2024-01-27: qty 2

## 2024-01-27 MED ORDER — SODIUM CHLORIDE 0.9% FLUSH: 3.0000 mL | Freq: Two times a day (BID) | INTRAVENOUS | Status: DC

## 2024-01-27 MED ORDER — FENTANYL CITRATE (PF) 100 MCG/2ML IJ SOLN
INTRAMUSCULAR | Status: AC
  Filled 2024-01-27: qty 2

## 2024-01-27 MED ORDER — ACETAMINOPHEN 325 MG PO TABS: 650.0000 mg | ORAL_TABLET | ORAL | Status: DC | PRN

## 2024-01-27 MED ORDER — ACETAMINOPHEN 500 MG PO TABS
1000.0000 mg | ORAL_TABLET | ORAL | Status: AC
  Administered 2024-01-27: 1000 mg via ORAL
  Filled 2024-01-27: qty 2

## 2024-01-27 MED ORDER — DEXMEDETOMIDINE HCL IN NACL 80 MCG/20ML IV SOLN
INTRAVENOUS | Status: DC | PRN
  Administered 2024-01-27: 6 ug via INTRAVENOUS

## 2024-01-27 MED ORDER — BUPIVACAINE LIPOSOME 1.3 % IJ SUSP: 20.0000 mL | Freq: Once | INTRAMUSCULAR | Status: DC

## 2024-01-27 MED ORDER — BUPIVACAINE-EPINEPHRINE (PF) 0.5% -1:200000 IJ SOLN
INTRAMUSCULAR | Status: DC | PRN
  Administered 2024-01-27: 50 mL

## 2024-01-27 MED ORDER — OXYCODONE HCL 5 MG PO TABS: 5.0000 mg | ORAL_TABLET | ORAL | Status: DC | PRN

## 2024-01-27 MED ORDER — BUPIVACAINE LIPOSOME 1.3 % IJ SUSP
INTRAMUSCULAR | Status: AC
  Filled 2024-01-27: qty 20

## 2024-01-27 MED ORDER — BUPIVACAINE-EPINEPHRINE (PF) 0.5% -1:200000 IJ SOLN
INTRAMUSCULAR | Status: AC
  Filled 2024-01-27: qty 30

## 2024-01-27 MED ORDER — LACTATED RINGERS IV SOLN: INTRAVENOUS | Status: DC | PRN

## 2024-01-27 MED ORDER — ACETAMINOPHEN 650 MG RE SUPP: 650.0000 mg | RECTAL | Status: DC | PRN

## 2024-01-27 MED ORDER — PROPOFOL 500 MG/50ML IV EMUL
INTRAVENOUS | Status: DC | PRN
  Administered 2024-01-27: 100 ug/kg/min via INTRAVENOUS

## 2024-01-27 MED ORDER — SODIUM CHLORIDE 0.9% FLUSH: 3.0000 mL | INTRAVENOUS | Status: DC | PRN

## 2024-01-27 MED ORDER — KETAMINE HCL 50 MG/5ML IJ SOSY
PREFILLED_SYRINGE | INTRAMUSCULAR | Status: AC
  Filled 2024-01-27: qty 5

## 2024-01-27 MED ORDER — FENTANYL CITRATE (PF) 50 MCG/ML IJ SOSY: 25.0000 ug | PREFILLED_SYRINGE | INTRAMUSCULAR | Status: DC | PRN

## 2024-01-27 MED ORDER — MIDAZOLAM HCL 5 MG/5ML IJ SOLN
INTRAMUSCULAR | Status: DC | PRN
  Administered 2024-01-27: 2 mg via INTRAVENOUS

## 2024-01-27 MED ORDER — OXYCODONE HCL 5 MG PO TABS: 5.0000 mg | ORAL_TABLET | ORAL | 0 refills | Status: DC | PRN

## 2024-01-27 MED ORDER — ORAL CARE MOUTH RINSE: 15.0000 mL | Freq: Once | OROMUCOSAL | Status: AC

## 2024-01-27 MED ORDER — ONDANSETRON HCL 4 MG/2ML IJ SOLN: 4.0000 mg | Freq: Once | INTRAMUSCULAR | Status: DC | PRN

## 2024-01-27 MED ORDER — CHLORHEXIDINE GLUCONATE 0.12 % MT SOLN
15.0000 mL | Freq: Once | OROMUCOSAL | Status: AC
  Administered 2024-01-27: 15 mL via OROMUCOSAL

## 2024-01-27 MED ORDER — ONDANSETRON HCL 4 MG/2ML IJ SOLN
INTRAMUSCULAR | Status: DC | PRN
  Administered 2024-01-27: 4 mg via INTRAVENOUS

## 2024-01-27 MED ORDER — SODIUM CHLORIDE 0.9 % IV SOLN: 250.0000 mL | INTRAVENOUS | Status: DC | PRN

## 2024-01-27 MED ORDER — DIBUCAINE (PERIANAL) 1 % EX OINT
TOPICAL_OINTMENT | CUTANEOUS | Status: AC
  Filled 2024-01-27: qty 28

## 2024-01-27 MED ORDER — LACTATED RINGERS IV SOLN
INTRAVENOUS | Status: DC
Start: 2024-01-27 — End: 2024-01-27

## 2024-01-27 MED ORDER — FENTANYL CITRATE (PF) 100 MCG/2ML IJ SOLN
INTRAMUSCULAR | Status: DC | PRN
  Administered 2024-01-27 (×2): 50 ug via INTRAVENOUS

## 2024-01-27 MED ORDER — CELECOXIB 200 MG PO CAPS
200.0000 mg | ORAL_CAPSULE | ORAL | Status: AC
  Administered 2024-01-27: 200 mg via ORAL
  Filled 2024-01-27: qty 1

## 2024-01-27 MED ORDER — GABAPENTIN 300 MG PO CAPS
300.0000 mg | ORAL_CAPSULE | ORAL | Status: AC
  Administered 2024-01-27: 300 mg via ORAL
  Filled 2024-01-27: qty 1

## 2024-01-27 MED ORDER — 0.9 % SODIUM CHLORIDE (POUR BTL) OPTIME
TOPICAL | Status: DC | PRN
  Administered 2024-01-27: 1000 mL

## 2024-01-27 MED ORDER — AMISULPRIDE (ANTIEMETIC) 5 MG/2ML IV SOLN: 10.0000 mg | Freq: Once | INTRAVENOUS | Status: DC | PRN

## 2024-01-27 SURGICAL SUPPLY — 25 items
BAG COUNTER SPONGE SURGICOUNT (BAG) IMPLANT
BLADE SURG 15 STRL LF DISP TIS (BLADE) ×2 IMPLANT
BRIEF MESH DISP LRG (UNDERPADS AND DIAPERS) ×2 IMPLANT
DRAPE LAPAROTOMY T 102X78X121 (DRAPES) ×2 IMPLANT
ELECT REM PT RETURN 15FT ADLT (MISCELLANEOUS) ×2 IMPLANT
GAUZE 4X4 16PLY ~~LOC~~+RFID DBL (SPONGE) ×2 IMPLANT
GAUZE PAD ABD 8X10 STRL (GAUZE/BANDAGES/DRESSINGS) IMPLANT
GAUZE SPONGE 4X4 12PLY STRL (GAUZE/BANDAGES/DRESSINGS) IMPLANT
GLOVE BIO SURGEON STRL SZ 6.5 (GLOVE) ×2 IMPLANT
GLOVE INDICATOR 6.5 STRL GRN (GLOVE) ×4 IMPLANT
GOWN STRL REUS W/ TWL XL LVL3 (GOWN DISPOSABLE) ×6 IMPLANT
KIT BASIN OR (CUSTOM PROCEDURE TRAY) ×2 IMPLANT
KIT TURNOVER KIT A (KITS) ×2 IMPLANT
NDL HYPO 22X1.5 SAFETY MO (MISCELLANEOUS) ×2 IMPLANT
NEEDLE HYPO 22X1.5 SAFETY MO (MISCELLANEOUS) ×1 IMPLANT
PACK BASIC VI WITH GOWN DISP (CUSTOM PROCEDURE TRAY) ×2 IMPLANT
PENCIL SMOKE EVACUATOR (MISCELLANEOUS) IMPLANT
SHEARS HARMONIC 9CM CVD (BLADE) IMPLANT
SPIKE FLUID TRANSFER (MISCELLANEOUS) ×2 IMPLANT
SPONGE SURGIFOAM ABS GEL 100 (HEMOSTASIS) IMPLANT
SURGILUBE 2OZ TUBE FLIPTOP (MISCELLANEOUS) ×2 IMPLANT
SUT CHROMIC 2 0 SH (SUTURE) IMPLANT
SUT CHROMIC 3 0 SH 27 (SUTURE) IMPLANT
SYR 20ML LL LF (SYRINGE) ×2 IMPLANT
TOWEL OR 17X26 10 PK STRL BLUE (TOWEL DISPOSABLE) ×2 IMPLANT

## 2024-01-27 NOTE — Op Note (Signed)
 01/27/2024  12:17 PM  PATIENT:  Nicole [name]  58 y.o. female  Patient Care Team: Lendia Boby CROME, NP-C as PCP - General (Family Medicine) Lavona Agent, MD as PCP - Cardiology (Cardiology) [facility] (Obstetrics and Gynecology)  PRE-OPERATIVE DIAGNOSIS:  GRADE 2 HEMORRHOID  RECTAL BLEEDING  POST-OPERATIVE DIAGNOSIS:  GRADE 3 HEMORRHOID RECTAL BLEEDING  PROCEDURE: SINGLE COLUMN HEMORRHOIDECTOMY    Surgeon(s): Debby Hila, MD  ASSISTANT: none   ANESTHESIA:   local and MAC  SPECIMEN:  Source of Specimen:  R posterior hemorrhoid  DISPOSITION OF SPECIMEN:  PATHOLOGY  COUNTS:  YES  PLAN OF CARE: Discharge to home after PACU  PATIENT DISPOSITION:  PACU - hemodynamically stable.  INDICATION: 58 y.o. F with continued rectal bleeding after hemorrhoidopexy   OR FINDINGS: Grade 3 ulcerated R posterior hemorrhoid  DESCRIPTION: the patient was identified in the preoperative holding area and taken to the OR where they were laid on the operating room table.  MAC anesthesia was induced without difficulty. The patient was then positioned in prone jackknife position with buttocks gently taped apart.  The patient was then prepped and draped in usual sterile fashion.  SCDs were noted to be in place prior to the initiation of anesthesia. A surgical timeout was performed indicating the correct patient, procedure, positioning and need for preoperative antibiotics.  A rectal block was performed using Marcaine  with epinephrine  mixed with Experel.    I began with a digital rectal exam.  Anal canal was gently dilated.  I then placed a Hill-Ferguson anoscope into the anal canal and evaluated this completely.  Patient had a mild external left lateral hemorrhoid.  There was no internal component.  There was an enlarged ulcerated grade 3 right posterior hemorrhoid.  This was elevated using an Allis clamp and incised using a 15 blade scalpel.  I carried the dissection down through the subcutaneous  tissues and bluntly dissected out the external and internal sphincter edges.  I then removed the hemorrhoidal tissue above this sharply.  This was sent to pathology for further evaluation.  The internal portion was closed using a running 3-0 chromic suture.  The external portion was then closed using a running 2-0 chromic suture.  A couple interrupted 3-0 and 2-0 chromic sutures were placed for added hemostasis.  A Gelfoam was placed into the anal canal at the end of the procedure and the incision was injected with local anesthesia for postoperative pain control.  Hemostasis was good.  A dressing was applied.  The patient was awakened from anesthesia and sent to the postanesthesia care unit in stable condition.  All counts were correct per operating room staff.  Hila JAYSON Debby, MD  Colorectal and General Surgery [facility] Surgery

## 2024-01-27 NOTE — Discharge Instructions (Addendum)

## 2024-01-27 NOTE — Anesthesia Preprocedure Evaluation (Signed)
 Anesthesia Evaluation  Patient identified by MRN, date of birth, ID band Patient awake    Reviewed: Allergy & Precautions, NPO status , Patient's Chart, lab work & pertinent test results  Airway Mallampati: II  TM Distance: >3 FB Neck ROM: Full    Dental  (+) Teeth Intact, Dental Advisory Given   Pulmonary former smoker   Pulmonary exam normal breath sounds clear to auscultation       Cardiovascular hypertension, Pt. on medications Normal cardiovascular exam Rhythm:Regular Rate:Normal     Neuro/Psych negative neurological ROS  negative psych ROS   GI/Hepatic Neg liver ROS,,,Grade II hemorrhoids   Endo/Other  negative endocrine ROS    Renal/GU negative Renal ROS     Musculoskeletal  (+) Arthritis ,    Abdominal   Peds  Hematology negative hematology ROS (+)   Anesthesia Other Findings Day of surgery medications reviewed with the patient.  Reproductive/Obstetrics                              Anesthesia Physical Anesthesia Plan  ASA: 2  Anesthesia Plan: MAC   Post-op Pain Management: Tylenol  PO (pre-op)* and Gabapentin  PO (pre-op)*   Induction: Intravenous  PONV Risk Score and Plan: 2 and TIVA, Treatment may vary due to age or medical condition, Dexamethasone , Ondansetron  and Midazolam   Airway Management Planned: Natural Airway and Simple Face Mask  Additional Equipment:   Intra-op Plan:   Post-operative Plan:   Informed Consent: I have reviewed the patients History and Physical, chart, labs and discussed the procedure including the risks, benefits and alternatives for the proposed anesthesia with the patient or authorized representative who has indicated his/her understanding and acceptance.     Dental advisory given  Plan Discussed with: CRNA  Anesthesia Plan Comments:         Anesthesia Quick Evaluation

## 2024-01-27 NOTE — Transfer of Care (Signed)
 Immediate Anesthesia Transfer of Care Note  Patient: Nicole [name]  Procedure(s) Performed: SINGLE COLUMN HEMORRHOIDECTOMY (Rectum)  Patient Location: PACU  Anesthesia Type:MAC  Level of Consciousness: awake and alert   Airway & Oxygen Therapy: Patient Spontanous Breathing and Patient connected to nasal cannula oxygen  Post-op Assessment: Report given to RN and Post -op Vital signs reviewed and stable  Post vital signs: Reviewed and stable  Last Vitals:  Vitals Value Taken Time  BP 112/57 01/27/24 12:26  Temp    Pulse 90 01/27/24 12:28  Resp 10 01/27/24 12:28  SpO2 99 % 01/27/24 12:28  Vitals shown include unfiled device data.  Last Pain:  Vitals:   01/27/24 1107  TempSrc: Oral  PainSc: 0-No pain         Complications: No notable events documented.

## 2024-01-27 NOTE — Anesthesia Postprocedure Evaluation (Signed)
 Anesthesia Post Note  Patient: Nicole [name]  Procedure(s) Performed: SINGLE COLUMN HEMORRHOIDECTOMY (Rectum)     Patient location during evaluation: PACU Anesthesia Type: MAC Level of consciousness: awake and alert Pain management: pain level controlled Vital Signs Assessment: post-procedure vital signs reviewed and stable Respiratory status: spontaneous breathing, nonlabored ventilation and respiratory function stable Cardiovascular status: stable and blood pressure returned to baseline Postop Assessment: no apparent nausea or vomiting Anesthetic complications: no   No notable events documented.  Last Vitals:  Vitals:   01/27/24 1314 01/27/24 1315  BP: (!) 152/92 (!) 150/84  Pulse: 83 76  Resp: 16   Temp: 36.6 C 36.6 C  SpO2: 100% 100%    Last Pain:  Vitals:   01/27/24 1315  TempSrc: Oral  PainSc:                  Garnette FORBES Skillern

## 2024-01-27 NOTE — Interval H&P Note (Signed)
 History and Physical Interval Note:  01/27/2024 11:36 AM  Nicole [name]  has presented today for surgery, with the diagnosis of GRADE 2 HEMORRHOID  RECTAL BLEEDING.  The various methods of treatment have been discussed with the patient and family. After consideration of risks, benefits and other options for treatment, the patient has consented to  Procedure(s): HEMORRHOIDECTOMY (N/A) as a surgical intervention.  The patient's history has been reviewed, patient examined, no change in status, stable for surgery.  I have reviewed the patient's chart and labs.  Questions were answered to the patient's satisfaction.     Bernarda JAYSON Ned, MD  Colorectal and General Surgery [facility] Surgery

## 2024-01-28 ENCOUNTER — Encounter (HOSPITAL_COMMUNITY): Payer: Self-pay | Admitting: General Surgery

## 2024-01-28 LAB — SURGICAL PATHOLOGY

## 2024-01-28 MED ORDER — KETAMINE HCL 50 MG/5ML IJ SOSY
PREFILLED_SYRINGE | INTRAMUSCULAR | Status: DC | PRN
  Administered 2024-01-27: 10 mg via INTRAVENOUS

## 2024-01-28 NOTE — Addendum Note (Signed)
 Addendum  created 01/28/24 1547 by Amiee Wiley, CRNA   Intraprocedure Meds edited

## 2024-02-12 ENCOUNTER — Other Ambulatory Visit: Payer: Self-pay | Admitting: Family Medicine

## 2024-02-14 ENCOUNTER — Ambulatory Visit: Admitting: Family Medicine

## 2024-02-15 ENCOUNTER — Ambulatory Visit: Admitting: Family Medicine

## 2024-02-22 ENCOUNTER — Ambulatory Visit (INDEPENDENT_AMBULATORY_CARE_PROVIDER_SITE_OTHER): Admitting: Family Medicine

## 2024-02-22 ENCOUNTER — Ambulatory Visit: Payer: Self-pay | Admitting: Family Medicine

## 2024-02-22 ENCOUNTER — Encounter: Payer: Self-pay | Admitting: Family Medicine

## 2024-02-22 VITALS — BP 126/84 | HR 72 | Temp 97.9°F | Ht 63.0 in | Wt 138.0 lb

## 2024-02-22 DIAGNOSIS — E7800 Pure hypercholesterolemia, unspecified: Secondary | ICD-10-CM | POA: Diagnosis not present

## 2024-02-22 DIAGNOSIS — Z87891 Personal history of nicotine dependence: Secondary | ICD-10-CM

## 2024-02-22 DIAGNOSIS — E538 Deficiency of other specified B group vitamins: Secondary | ICD-10-CM

## 2024-02-22 DIAGNOSIS — I10 Essential (primary) hypertension: Secondary | ICD-10-CM

## 2024-02-22 DIAGNOSIS — I700 Atherosclerosis of aorta: Secondary | ICD-10-CM

## 2024-02-22 DIAGNOSIS — Z122 Encounter for screening for malignant neoplasm of respiratory organs: Secondary | ICD-10-CM

## 2024-02-22 LAB — COMPREHENSIVE METABOLIC PANEL WITH GFR
ALT: 19 U/L (ref 0–35)
AST: 21 U/L (ref 0–37)
Albumin: 4.5 g/dL (ref 3.5–5.2)
Alkaline Phosphatase: 71 U/L (ref 39–117)
BUN: 11 mg/dL (ref 6–23)
CO2: 30 meq/L (ref 19–32)
Calcium: 9.4 mg/dL (ref 8.4–10.5)
Chloride: 104 meq/L (ref 96–112)
Creatinine, Ser: 0.77 mg/dL (ref 0.40–1.20)
GFR: 83.97 mL/min (ref >60.00)
Glucose, Bld: 93 mg/dL (ref 70–99)
Potassium: 4 meq/L (ref 3.5–5.1)
Sodium: 141 meq/L (ref 135–145)
Total Bilirubin: 0.3 mg/dL (ref 0.2–1.2)
Total Protein: 7.1 g/dL (ref 6.0–8.3)

## 2024-02-22 LAB — LIPID PANEL
Cholesterol: 161 mg/dL (ref 0–200)
HDL: 72.7 mg/dL (ref >39.00)
LDL Cholesterol: 71 mg/dL (ref 0–99)
NonHDL: 88.35
Total CHOL/HDL Ratio: 2
Triglycerides: 87 mg/dL (ref 0.0–149.0)
VLDL: 17.4 mg/dL (ref 0.0–40.0)

## 2024-02-22 LAB — VITAMIN B12: Vitamin B-12: 503 pg/mL (ref 211–911)

## 2024-02-22 NOTE — Progress Notes (Signed)
 Subjective:     Patient ID: Nicole [name], female    DOB: February 10, 1966, 58 y.o.   MRN: [id number]  Chief Complaint  Patient presents with   Medical Management of Chronic Issues    Fasting 6 week f/u, recheck cholesterol and check BP. States it has been running normal again    HPI  Discussed the use of AI scribe software for clinical note transcription with the patient, who gave verbal consent to proceed.  History of Present Illness Nicole [name] is a 58 year old female who presents for follow-up on chronic health conditions.  Hyperlipidemia - Currently managed with Crestor  10 mg daily - Previously treated with Lipitor, discontinued due to fatigue, muscle ache, and headache - Tolerating Crestor  without significant side effects - Recent laboratory results (September 23rd): LDL 120 mg/dL, HDL and triglycerides within normal limits - Lycoprotein A test performed  Atherosclerosis - CT coronary calcium  test revealed atherosclerosis  Hemorrhoidal disease - Hemorrhoid procedure performed three weeks ago - No longer taking oxycodone  for pain - Follow-up appointment scheduled today  Hypertension - Managed with amlodipine  5 mg daily  Vitamin b12 status - History of low B12 - Switched to multivitamin after elevated B12 levels last year  Preventive health maintenance - Pap smear performed in February at [facility] - Plans to continue regular screening  Tobacco use - History of smoking, but has abstained for a long time - Lung test performed approximately ten years ago, results were normal    Health Maintenance Due  Topic Date Due   Lung Cancer Screening  Never done   Cervical Cancer Screening (HPV/Pap Cotest)  04/23/2024    Past Medical History:  Diagnosis Date   Allergy    Seasonal   Arthritis    Hypertension    Nonallopathic lesion of lumbosacral region 06/15/2019    Past Surgical History:  Procedure Laterality Date   ablasion     BREAST SURGERY Right 25years  ago   biopsy   COLONOSCOPY  ? 2012   Dr.Mann-polyps-recall 5 years per pt   HEMORRHOID SURGERY N/A 01/27/2024   Procedure: SINGLE COLUMN HEMORRHOIDECTOMY;  Surgeon: Debby Hila, MD;  Location: [facility];  Service: General;  Laterality: N/A;   HIGH RESOLUTION ANOSCOPY N/A 09/22/2023   Procedure: ANOSCOPY, HIGH RESOLUTION;  Surgeon: Debby Hila, MD;  Location: [facility];  Service: General;  Laterality: N/A;   RECTAL BIOPSY N/A 09/22/2023   Procedure: BIOPSY, RECTUM, HEMORRHOIDOPEXY;  Surgeon: Debby Hila, MD;  Location: [facility];  Service: General;  Laterality: N/A;  POSSIBLE BIOPSY OR ABLATION, POSSIBLE HEMORRHOIDALPEXY   TUBAL LIGATION      Family History  Problem Relation Age of Onset   Hypertension Mother    Hypothyroidism Mother    Arthritis Mother    Heart disease Mother    Heart attack Mother        with covid age 79   Memory loss Mother    Heart attack Father 36   Heart disease Father    Alzheimer's disease Father    Congestive Heart Failure Father    Hyperlipidemia Father    Hypertension Father    Heart attack Sister 39   Hypertension Sister    Varicose Veins Sister    Alzheimer's disease Paternal Aunt    Alzheimer's disease Paternal Uncle    Colon polyps Neg Hx    Colon cancer Neg Hx    Esophageal cancer Neg Hx    Stomach cancer Neg Hx    Rectal cancer Neg  Hx     Social History   Socioeconomic History   Marital status: Married    Spouse name: Not on file   Number of children: 2   Years of education: Not on file   Highest education level: Bachelor's degree (e.g., BA, AB, BS)  Occupational History   Occupation: conservator, museum/gallery for the [employer]  Tobacco Use   Smoking status: Former    Current packs/day: 1.00    Average packs/day: 1 pack/day for 20.0 years (20.0 ttl pk-yrs)    Types: Cigarettes   Smokeless tobacco: Never  Vaping Use   Vaping status: Never Used  Substance and Sexual Activity   Alcohol use: Yes    Alcohol/week: 2.0 standard drinks of  alcohol    Types: 2 Glasses of wine per week    Comment: occas.   Drug use: No   Sexual activity: Yes    Birth control/protection: Surgical  Other Topics Concern   Not on file  Social History Narrative   Not on file   Social Drivers of Health   Financial Resource Strain: Low Risk  (02/18/2024)   Overall Financial Resource Strain (CARDIA)    Difficulty of Paying Living Expenses: Not hard at all  Food Insecurity: No Food Insecurity (02/18/2024)   Hunger Vital Sign    Worried About Running Out of Food in the Last Year: Never true    Ran Out of Food in the Last Year: Never true  Transportation Needs: No Transportation Needs (02/18/2024)   PRAPARE - Administrator, Civil Service (Medical): No    Lack of Transportation (Non-Medical): No  Physical Activity: Sufficiently Active (02/18/2024)   Exercise Vital Sign    Days of Exercise per Week: 5 days    Minutes of Exercise per Session: 30 min  Stress: No Stress Concern Present (02/18/2024)   [employer] of Occupational Health - Occupational Stress Questionnaire    Feeling of Stress: Only a little  Social Connections: Moderately Integrated (02/18/2024)   Social Connection and Isolation Panel    Frequency of Communication with Friends and Family: More than three times a week    Frequency of Social Gatherings with Friends and Family: Twice a week    Attends Religious Services: More than 4 times per year    Active Member of [employer] or Organizations: No    Attends Engineer, Structural: Not on file    Marital Status: Married  Catering Manager Violence: Not on file    Outpatient Medications Prior to Visit  Medication Sig Dispense Refill   amLODipine  (NORVASC ) 5 MG tablet Take 1 tablet (5 mg total) by mouth daily. 30 tablet 6   docusate sodium (COLACE) 100 MG capsule Take 100 mg by mouth daily.     estradiol (ESTRACE) 1 MG tablet Take 1 mg by mouth daily.     MAGNESIUM PO Take 1 tablet by mouth daily.     Multiple  Vitamin (MULTIVITAMIN ADULT PO) Take 1 capsule by mouth daily. With omega 3     progesterone (PROMETRIUM) 100 MG capsule Take 100 mg by mouth at bedtime.     rosuvastatin  (CRESTOR ) 10 MG tablet Take 1 tablet (10 mg total) by mouth daily. 90 tablet 0   tiZANidine  (ZANAFLEX ) 4 MG tablet TAKE 1 TABLET BY MOUTH EVERYDAY AT BEDTIME 30 tablet 5   valACYclovir (VALTREX) 1000 MG tablet Take 2,000 mg by mouth daily as needed (fever blisters).     oxyCODONE  (OXY IR/ROXICODONE ) 5 MG immediate release tablet Take  1 tablet (5 mg total) by mouth every 4 (four) hours as needed for severe pain (pain score 7-10). 30 tablet 0   No facility-administered medications prior to visit.    Allergies  Allergen Reactions   Penicillins Hives    Review of Systems  Constitutional:  Negative for chills, fever and malaise/fatigue.  Respiratory:  Negative for cough, shortness of breath and wheezing.   Cardiovascular:  Negative for chest pain, palpitations and leg swelling.  Gastrointestinal:  Negative for abdominal pain, constipation, diarrhea, nausea and vomiting.  Genitourinary:  Negative for dysuria, frequency and urgency.  Musculoskeletal:  Negative for joint pain and myalgias.  Neurological:  Negative for dizziness and headaches.       Objective:    Physical Exam Constitutional:      General: She is not in acute distress.    Appearance: She is not ill-appearing.  Eyes:     Extraocular Movements: Extraocular movements intact.     Conjunctiva/sclera: Conjunctivae normal.  Cardiovascular:     Rate and Rhythm: Normal rate.  Pulmonary:     Effort: Pulmonary effort is normal.  Musculoskeletal:     Cervical back: Normal range of motion and neck supple.  Skin:    General: Skin is warm and dry.  Neurological:     General: No focal deficit present.     Mental Status: She is alert and oriented to person, place, and time.  Psychiatric:        Mood and Affect: Mood normal.        Behavior: Behavior normal.         Thought Content: Thought content normal.      BP 126/84   Pulse 72   Temp 97.9 F (36.6 C) (Temporal)   Ht 5' 3 (1.6 m)   Wt 138 lb (62.6 kg)   SpO2 99%   BMI 24.45 kg/m  Wt Readings from Last 3 Encounters:  02/22/24 138 lb (62.6 kg)  01/27/24 135 lb (61.2 kg)  01/25/24 135 lb (61.2 kg)       Assessment & Plan:   Problem List Items Addressed This Visit     Aortic atherosclerosis   Relevant Orders   Lipid panel (Completed)   Former smoker   Hyperlipidemia - Primary   Relevant Orders   Comprehensive metabolic panel with GFR (Completed)   Lipid panel (Completed)   Other Visit Diagnoses       Primary hypertension       Relevant Orders   Comprehensive metabolic panel with GFR (Completed)     Screening for lung cancer       Relevant Orders   Ambulatory Referral Lung Cancer Screening [facility]     Vitamin B 12 deficiency       Relevant Orders   Vitamin B12 (Completed)       Assessment and Plan Assessment & Plan Hyperlipidemia with statin-associated myalgia and fatigue Previously experienced side effects with Lipitor, including fatigue, muscle ache, and headache. Switched to Crestor  10 mg, which is well-tolerated. Recent lipid panel shows LDL at 120 mg/dL, HDL and triglycerides are within normal limits. Lycoprotein A test indicates low cardiovascular risk. - Continue Crestor  10 mg daily - Check fasting lipids  Aortic atherosclerosis CT coronary calcium  scan showed atherosclerosis, indicating the need for cholesterol management. Current treatment with Crestor  is aimed at reducing cardiovascular risk. - Continue current management with Crestor   Hypertension Blood pressure is well-controlled with current medication regimen. 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 Previous pulmonary tests were normal. Discussed lung cancer screening  with low-dose CT scan, which is recommended due to smoking history. She is open to undergoing the screening. - Ordered low-dose CT scan for lung cancer screening  Vitamin B12 deficiency, history of Previously had high B12 levels with supplementation. Currently taking a multivitamin instead of B12 supplements. Plan to check B12 levels to ensure adequate intake. - Ordered B12 level test     I have discontinued Markan Coller Lisa's oxyCODONE . I am also having her maintain her estradiol, progesterone, MAGNESIUM PO, Multiple Vitamin (MULTIVITAMIN ADULT PO), docusate sodium, valACYclovir, amLODipine , rosuvastatin , and tiZANidine .  No orders of the defined types were placed in this encounter.

## 2024-03-22 ENCOUNTER — Ambulatory Visit (INDEPENDENT_AMBULATORY_CARE_PROVIDER_SITE_OTHER)

## 2024-03-22 DIAGNOSIS — M8580 Other specified disorders of bone density and structure, unspecified site: Secondary | ICD-10-CM | POA: Diagnosis not present

## 2024-04-09 ENCOUNTER — Other Ambulatory Visit: Payer: Self-pay | Admitting: Family Medicine

## 2024-04-09 DIAGNOSIS — E7800 Pure hypercholesterolemia, unspecified: Secondary | ICD-10-CM

## 2024-06-12 ENCOUNTER — Encounter (HOSPITAL_BASED_OUTPATIENT_CLINIC_OR_DEPARTMENT_OTHER): Admitting: Radiology

## 2024-06-12 DIAGNOSIS — Z1231 Encounter for screening mammogram for malignant neoplasm of breast: Secondary | ICD-10-CM
